# Patient Record
Sex: Female | Born: 1985 | Race: White | Hispanic: No | Marital: Single | Smoking: Never smoker
Health system: Southern US, Community
[De-identification: ages and names within clinical notes are randomized; demographics above are authoritative.]

## PROBLEM LIST (undated history)

## (undated) DIAGNOSIS — K589 Irritable bowel syndrome without diarrhea: Secondary | ICD-10-CM

## (undated) DIAGNOSIS — E0590 Thyrotoxicosis, unspecified without thyrotoxic crisis or storm: Secondary | ICD-10-CM

## (undated) HISTORY — DX: Irritable bowel syndrome, unspecified: K58.9

## (undated) HISTORY — PX: AUGMENTATION MAMMAPLASTY: SUR837

## (undated) HISTORY — PX: LEEP: SHX91

---
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
## 2007-10-13 ENCOUNTER — Ambulatory Visit: Payer: Self-pay | Admitting: Family Medicine

## 2007-10-13 DIAGNOSIS — R109 Unspecified abdominal pain: Secondary | ICD-10-CM | POA: Insufficient documentation

## 2007-10-13 DIAGNOSIS — R350 Frequency of micturition: Secondary | ICD-10-CM | POA: Insufficient documentation

## 2007-10-13 DIAGNOSIS — K219 Gastro-esophageal reflux disease without esophagitis: Secondary | ICD-10-CM | POA: Insufficient documentation

## 2007-10-13 DIAGNOSIS — N898 Other specified noninflammatory disorders of vagina: Secondary | ICD-10-CM | POA: Insufficient documentation

## 2007-10-13 LAB — CONVERTED CEMR LAB
Blood in Urine, dipstick: NEGATIVE
Glucose, Urine, Semiquant: NEGATIVE
Ketones, urine, test strip: NEGATIVE
Nitrite: NEGATIVE
Protein, U semiquant: NEGATIVE
Specific Gravity, Urine: 1.015
Urobilinogen, UA: 0.2
WBC Urine, dipstick: NEGATIVE

## 2007-10-15 LAB — CONVERTED CEMR LAB
AST: 23 units/L (ref 0–37)
Albumin: 3.6 g/dL (ref 3.5–5.2)
Alkaline Phosphatase: 44 units/L (ref 39–117)
Basophils Relative: 0 % (ref 0.0–3.0)
Bilirubin, Direct: 0.1 mg/dL (ref 0.0–0.3)
CO2: 29 meq/L (ref 19–32)
Chloride: 102 meq/L (ref 96–112)
Creatinine, Ser: 0.8 mg/dL (ref 0.4–1.2)
Eosinophils Relative: 1.7 % (ref 0.0–5.0)
GFR calc non Af Amer: 96 mL/min
MCHC: 34.6 g/dL (ref 30.0–36.0)
MCV: 92.5 fL (ref 78.0–100.0)
Monocytes Absolute: 0.1 10*3/uL (ref 0.1–1.0)
Monocytes Relative: 2 % — ABNORMAL LOW (ref 3.0–12.0)
Neutro Abs: 5 10*3/uL (ref 1.4–7.7)
Neutrophils Relative %: 72.4 % (ref 43.0–77.0)
Platelets: 222 10*3/uL (ref 150–400)
RDW: 11.6 % (ref 11.5–14.6)
Sed Rate: 15 mm/hr (ref 0–22)
TSH: 1.17 microintl units/mL (ref 0.35–5.50)
Total Bilirubin: 0.6 mg/dL (ref 0.3–1.2)
Total Protein: 6.6 g/dL (ref 6.0–8.3)

## 2008-06-27 ENCOUNTER — Telehealth: Payer: Self-pay | Admitting: Family Medicine

## 2008-06-27 ENCOUNTER — Ambulatory Visit: Payer: Self-pay | Admitting: Family Medicine

## 2008-06-27 DIAGNOSIS — B373 Candidiasis of vulva and vagina: Secondary | ICD-10-CM | POA: Insufficient documentation

## 2008-06-27 LAB — CONVERTED CEMR LAB

## 2008-07-03 ENCOUNTER — Telehealth: Payer: Self-pay | Admitting: Family Medicine

## 2009-02-27 ENCOUNTER — Ambulatory Visit: Payer: Self-pay | Admitting: Family Medicine

## 2009-02-27 ENCOUNTER — Encounter (INDEPENDENT_AMBULATORY_CARE_PROVIDER_SITE_OTHER): Payer: Self-pay | Admitting: *Deleted

## 2009-02-27 DIAGNOSIS — R197 Diarrhea, unspecified: Secondary | ICD-10-CM | POA: Insufficient documentation

## 2009-02-27 DIAGNOSIS — N63 Unspecified lump in unspecified breast: Secondary | ICD-10-CM | POA: Insufficient documentation

## 2009-03-01 LAB — CONVERTED CEMR LAB
ALT: 33 units/L (ref 0–35)
AST: 39 units/L — ABNORMAL HIGH (ref 0–37)
Albumin: 3.9 g/dL (ref 3.5–5.2)
BUN: 5 mg/dL — ABNORMAL LOW (ref 6–23)
Basophils Absolute: 0 10*3/uL (ref 0.0–0.1)
CO2: 27 meq/L (ref 19–32)
Calcium: 9.4 mg/dL (ref 8.4–10.5)
Eosinophils Absolute: 0.1 10*3/uL (ref 0.0–0.7)
Eosinophils Relative: 1.8 % (ref 0.0–5.0)
GFR calc non Af Amer: 110.66 mL/min (ref >60)
Hemoglobin: 13.7 g/dL (ref 12.0–15.0)
MCHC: 33.6 g/dL (ref 30.0–36.0)
MCV: 94.1 fL (ref 78.0–100.0)
Neutro Abs: 3.7 10*3/uL (ref 1.4–7.7)
Platelets: 206 10*3/uL (ref 150.0–400.0)
RDW: 11.2 % — ABNORMAL LOW (ref 11.5–14.6)
Sodium: 141 meq/L (ref 135–145)
Total Protein: 7 g/dL (ref 6.0–8.3)
WBC: 5.7 10*3/uL (ref 4.5–10.5)

## 2009-03-05 ENCOUNTER — Encounter: Admission: RE | Admit: 2009-03-05 | Discharge: 2009-03-05 | Payer: Self-pay | Admitting: Family Medicine

## 2009-03-26 ENCOUNTER — Ambulatory Visit: Payer: Self-pay | Admitting: Gastroenterology

## 2009-03-26 DIAGNOSIS — R112 Nausea with vomiting, unspecified: Secondary | ICD-10-CM | POA: Insufficient documentation

## 2009-03-26 LAB — CONVERTED CEMR LAB
Albumin: 4 g/dL (ref 3.5–5.2)
Alkaline Phosphatase: 44 units/L (ref 39–117)
Basophils Relative: 0.6 % (ref 0.0–3.0)
Calcium: 9.4 mg/dL (ref 8.4–10.5)
Chloride: 105 meq/L (ref 96–112)
Eosinophils Absolute: 0.1 10*3/uL (ref 0.0–0.7)
GFR calc non Af Amer: 110.58 mL/min (ref >60)
Glucose, Bld: 84 mg/dL (ref 70–99)
HCT: 41.1 % (ref 36.0–46.0)
Hemoglobin: 13.8 g/dL (ref 12.0–15.0)
Lymphs Abs: 2.2 10*3/uL (ref 0.7–4.0)
MCHC: 33.6 g/dL (ref 30.0–36.0)
MCV: 92.7 fL (ref 78.0–100.0)
Neutro Abs: 3 10*3/uL (ref 1.4–7.7)
Platelets: 220 10*3/uL (ref 150.0–400.0)
Potassium: 3.9 meq/L (ref 3.5–5.1)
RBC: 4.44 M/uL (ref 3.87–5.11)
Sodium: 141 meq/L (ref 135–145)
Total Protein: 7.6 g/dL (ref 6.0–8.3)

## 2009-04-11 ENCOUNTER — Ambulatory Visit: Payer: Self-pay | Admitting: Gastroenterology

## 2009-05-09 ENCOUNTER — Ambulatory Visit: Payer: Self-pay | Admitting: Family Medicine

## 2009-05-09 LAB — CONVERTED CEMR LAB
KOH Prep: NEGATIVE
Ketones, urine, test strip: NEGATIVE
Nitrite: NEGATIVE
Protein, U semiquant: NEGATIVE
Urobilinogen, UA: 0.2
pH: 7.5

## 2009-09-21 ENCOUNTER — Telehealth: Payer: Self-pay | Admitting: Family Medicine

## 2009-09-24 ENCOUNTER — Telehealth: Payer: Self-pay | Admitting: Family Medicine

## 2009-09-28 ENCOUNTER — Ambulatory Visit: Payer: Self-pay | Admitting: Family Medicine

## 2009-11-20 ENCOUNTER — Telehealth: Payer: Self-pay | Admitting: Family Medicine

## 2010-02-26 NOTE — Progress Notes (Signed)
Summary: needs more diflucan  Phone Note Call from Patient Call back at [phone]   Caller: Patient Summary of Call: Pt was treated a few days ago with diflucan, for a yeast infection.  Sxs are not any better and pt is asking for a refill to be sent to [facility].  She says it usually takes more than one dose to clear her up. Initial call taken by: Lowella Petties CMA,  September 24, 2009 9:12 AM  Follow-up for Phone Call        sent -- f/u with AEB if not clearing. Follow-up by: Hannah Beat MD,  September 24, 2009 9:14 AM  Additional Follow-up for Phone Call Additional follow up Details #1::        patient advised.Consuello Masse CMA   Additional Follow-up by: Benny Lennert CMA Duncan Dull),  September 24, 2009 9:19 AM    Prescriptions: FLUCONAZOLE 150 MG TABS (FLUCONAZOLE) 1 tab po daily  #2 x 0   Entered and Authorized by:   Hannah Beat MD   Signed by:   Hannah Beat MD on 09/24/2009   Method used:   [facility]ically [facility]        [facility] [address]       [address]       [place], [place]  [address]       Ph: [phone]       Fax: [phone]   RxID:   [id number]

## 2010-02-26 NOTE — Letter (Signed)
Summary: Results Letter  [facility]  [address] [place], [place] [address]   Phone: [phone]  Fax: [phone]        March 26, 2009 MRN: [id number]    [facility] [address] [place], [place]  [address]    Dear Ms. Forlenza,  It is my pleasure to have treated you recently as a new patient in my office. I appreciate your confidence and the opportunity to participate in your care.  Since I do have a busy inpatient endoscopy schedule and office schedule, my office hours vary weekly. I am, however, available for emergency calls everyday through my office. If I am not available for an urgent office appointment, another one of our gastroenterologist will be able to assist you.  My well-trained staff are prepared to help you at all times. For emergencies after office hours, a physician from our Gastroenterology section is always available through my 24 hour answering service  Once again I welcome you as a new patient and I look forward to a happy and healthy relationship             Sincerely,  Louis Meckel MD  This letter has been electronically signed by your physician.  Appended Document: Results Letter letter mailed

## 2010-02-26 NOTE — Procedures (Signed)
Summary: Upper Endoscopy  Patient: Mary Mcclure Note: All result statuses are Final unless otherwise noted.  Tests: (1) Upper Endoscopy (EGD)   EGD Upper Endoscopy       DONE     [facility]     [address]     [place], [place]  [address]           ENDOSCOPY PROCEDURE REPORT           PATIENT:  Mary, Mcclure  MR#:  [id number]     BIRTHDATE:  1985-09-20, 23 yrs. old  GENDER:  female           ENDOSCOPIST:  Barbette Hair. Arlyce Dice, MD     Referred by:  Excell Seltzer, M.D.           PROCEDURE DATE:  04/11/2009     PROCEDURE:  EGD, diagnostic     ASA CLASS:  Class I     INDICATIONS:  nausea and vomiting           MEDICATIONS:   Fentanyl 75 mcg IV, Versed 7 mg IV, glycopyrrolate     (Robinal) 0.2 mg IV, 0.6cc simethancone 0.6 cc PO     TOPICAL ANESTHETIC:  Exactacain Spray           DESCRIPTION OF PROCEDURE:   After the risks benefits and     alternatives of the procedure were thoroughly explained, informed     consent was obtained.  The LB [id number] endoscope was     introduced through the mouth and advanced to the third portion of     the duodenum, without limitations.  The instrument was slowly     withdrawn as the mucosa was fully examined.     <<PROCEDUREIMAGES>>           The upper, middle, and distal third of the esophagus were     carefully inspected and no abnormalities were noted. The z-line     was well seen at the GEJ. The endoscope was pushed into the fundus     which was normal including a retroflexed view. The antrum,gastric     body, first and second part of the duodenum were unremarkable (see     image1, image2, image3, image4, image5, image6, and image7).     Retroflexed views revealed no abnormalities.    The scope was then     withdrawn from the patient and the procedure completed.     COMPLICATIONS:  None           ENDOSCOPIC IMPRESSION:     1) Normal EGD     RECOMMENDATIONS:     1) continue PPI - aciphex (pt reports improvement in  symptoms)     2) Call office next 2-3 days to schedule an office appointment     for 2-3 weeks           REPEAT EXAM:  No           ______________________________     Barbette Hair. Arlyce Dice, MD           CC:           n.     eSIGNED:   Barbette Hair. Kaplan at 04/11/2009 02:08 PM           Mary Mcclure, [id number]  Note: An exclamation mark (!) indicates a result that was not dispersed into the flowsheet. Document Creation Date: 04/11/2009 2:08 PM _______________________________________________________________________  (1) Order result status: Final  Collection or observation date-time: 04/11/2009 14:02 Requested date-time:  Receipt date-time:  Reported date-time:  Referring Physician:   Ordering Physician: Melvia Heaps [phone]) Specimen Source:  Source: Launa Grill Order Number: [id number] Lab site:

## 2010-02-26 NOTE — Progress Notes (Signed)
Summary: pt has another yeast infection  Phone Note Call from Patient Call back at [phone]   Caller: Patient Call For: Mary Nora MD Summary of Call: Pt has another yeast infection- discharge, itching, swelling.  She is asking for diflucan, but she says the usual dose does not work for her.  She is asking if she can have refills on this, since she gets these infections so frequently.  Uses [facility]. Initial call taken by: Lowella Petties CMA, AAMA,  November 20, 2009 8:25 AM  Follow-up for Phone Call        patient advised and also told if this doesnt work then patient will need office visit.   November 20, 2009 9:11 AM  Follow-up by: Benny Lennert CMA Duncan Dull),  November 20, 2009 9:11 AM    Prescriptions: FLUCONAZOLE 150 MG TABS (FLUCONAZOLE) 1 tab po daily  #5 x 1   Entered and Authorized by:   Mary Nora MD   Signed by:   Mary Nora MD on 11/20/2009   Method used:   [facility]ronically to        [facility] [address]       [address], [address]       [place], [place]  [address]       Ph: [phone]       Fax: [phone]   RxID:   [id number]

## 2010-02-26 NOTE — Assessment & Plan Note (Signed)
Summary: yeast infection/jbb   Vital Signs:  Patient Profile:   25 Years Old Female CC:      Possible Yeast Infection / rwt Height:     64.75 inches Weight:      124 pounds BMI:     20.87 O2 Sat:      99 % O2 treatment:    Room Air Temp:     97.9 degrees F oral Pulse rate:   76 / minute Pulse rhythm:   regular Resp:     18 per minute BP sitting:   134 / 85  Pt. in pain?   no  Vitals Entered By: Levonne Spiller EMT-P (September 28, 2009 4:21 PM)              Is Patient Diabetic? No      Current Allergies: No known allergies  History of Present Illness History from: patient Reason for visit: see chief complaint Chief Complaint: Possible Yeast Infection / rwt History of Present Illness: Was treated for a vaginal yeast infection with diflucan on 8/26, she did not get much relief and was treated again with diflucan this time at a higher dosage. She reports some relief, but continues with itch, white vaginal discharge and erythema. With her last yeast infection, it took about 3 weeks to clear up. Previously she had Abx prior, but this time was no known trigger.  REVIEW OF SYSTEMS Constitutional Symptoms      Denies fever, chills, night sweats, weight loss, weight gain, and fatigue.       Genitourniary       Denies painful urination, blood or discharge from vagina, kidney stones, and loss of urinary control.      Comments: d/c  PMH-FH-SH reviewed for relevance Physical Exam General appearance: well developed, well nourished, no acute distress Skin: no obvious rashes or lesions MSE: oriented to time, place, and person  Plan New Medications/Changes: KETOCONAZOLE 200 MG TABS (KETOCONAZOLE) 1 by mouth daily for infection x 10 days  #10 x 0, 09/28/2009, Tacey Ruiz MD  New Orders: Est. Patient Level II [[address]] Planning Comments:   Will send for culture if no improvement or f/u with primary care physician.   The patient and/or caregiver has been counseled thoroughly with  regard to medications prescribed including dosage, schedule, interactions, rationale for use, and possible side effects and they verbalize understanding.  Diagnoses and expected course of recovery discussed and will return if not improved as expected or if the condition worsens. Patient and/or caregiver verbalized understanding.   Prescriptions: KETOCONAZOLE 200 MG TABS (KETOCONAZOLE) 1 by mouth daily for infection x 10 days  #10 x 0   Entered and Authorized by:   Tacey Ruiz MD   Signed by:   Tacey Ruiz MD on 09/28/2009   Method used:   Electronically to        [facility] [address]       [address], [address]       [place], [place]  [address]       Ph: [phone]       Fax: [phone]   RxID:   [id number]   Orders Added: 1)  Est. Patient Level II [[address]] The risks, benefits and possible side effects of the treatments and tests were explained clearly to the patient and the patient verbalized understanding. The patient was informed that there is no on-call provider or services available at this clinic during  off-hours (when the clinic is closed).  If the patient developed a problem or concern that required immediate attention, the patient was advised to go the the nearest available urgent care or emergency department for medical care.  The patient verbalized understanding.

## 2010-02-26 NOTE — Assessment & Plan Note (Signed)
Summary: ?YEAST INFECTION/CLE   Vital Signs:  Patient profile:   25 year old female Height:      64.5 inches Weight:      132.6 pounds BMI:     22.49 Temp:     97.9 degrees F oral Pulse rate:   72 / minute Pulse rhythm:   regular BP sitting:   100 / 60  (left arm) Cuff size:   regular  Vitals Entered By: Benny Lennert CMA Duncan Dull) (May 09, 2009 3:29 PM)  History of Present Illness: Chief complaint ? yeast infection  Vaginal irritation, small amount of white discharge.  No recent antibitoics.  HAs history of frewquent yeast infections.  No fever, no abdominal pain.  Sexually active. No OTC douche or cream used. Does have new body wash.   urine cloudy as well. No dysuria.   Problems Prior to Update: 1)  Nausea With Vomiting  (ICD-787.01) 2)  Breast Mass, Left  (ICD-611.72) 3)  Diarrhea, Chronic  (ICD-787.91) 4)  Candidiasis of Vulva and Vagina  (ICD-112.1) 5)  Frequency, Urinary  (ICD-788.41) 6)  Vaginal Discharge  (ICD-623.5) 7)  Gerd  (ICD-530.81) 8)  Abdominal Cramps  (ICD-789.00)  Current Medications (verified): 1)  Ortho Tri-Cyclen (28) 0.035 Mg Tabs (Norgestimate-Ethinyl Estradiol) .... Take 1 Tablet By Mouth Once A Day  Allergies (verified): No Known Drug Allergies  Past History:  Past medical, surgical, family and social histories (including risk factors) reviewed, and no changes noted (except as noted below).  Past Medical History: Reviewed history from 10/13/2007 and no changes required. Unremarkable  Past Surgical History: Reviewed history from 10/13/2007 and no changes required. Denies surgical history  Family History: Reviewed history from 03/26/2009 and no changes required. father: GERD, HTN mother:healthy MGM: lung cancer Family History of Colon Cancer: MGM-deceased 43, Father-dx'd early 33's Family History of Colitis/Crohn's: MGF Family History of Colon Polyps: MGF Family History of Diabetes: MGGF, MGGM, Mat Uncle Family History of  Heart Disease: MGGF, Mat Uncle, Father-chronic A Fib  Social History: Reviewed history from 03/26/2009 and no changes required. 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 CV:  Denies chest pain or discomfort.  Physical Exam  General:  Well-developed,well-nourished,in no acute distress; alert,appropriate and cooperative throughout examination Genitalia:  normal introitus, no external lesions, mucosa pink and moist, and vaginal discharge.     Impression & Recommendations:  Problem # 1:  CANDIDIASIS OF VULVA AND VAGINA (ICD-112.1)  Treat yeast infecton with fluconazole.  Her updated medication list for this problem includes:    Fluconazole 150 Mg Tabs (Fluconazole) .Marland Kitchen... 1 tab po daily  Orders: Wet Prep (54098JX) UA Dipstick w/o Micro (manual) (91478)  Complete Medication List: 1)  Ortho Tri-cyclen (28) 0.035 Mg Tabs (Norgestimate-ethinyl estradiol) .... Take 1 tablet by mouth once a day 2)  Fluconazole 150 Mg Tabs (Fluconazole) .Marland Kitchen.. 1 tab po daily Prescriptions: FLUCONAZOLE 150 MG TABS (FLUCONAZOLE) 1 tab po daily  #1 x 0   Entered and Authorized by:   Kerby Nora MD   Signed by:   Kerby Nora MD on 05/09/2009   Method used:   Electronically to        [facility] [address]       [address]       [place], [place]  [address]       Ph: [phone]       Fax: [phone]   RxID:   [id number]   Current Allergies (reviewed today): No known allergies   Laboratory Results   Urine Tests  Date/Time Received: May 09, 2009 3:36 PM  Date/Time Reported: May 09, 2009 3:36 PM   Routine Urinalysis   Color:  yellow Appearance: Clear Glucose: negative   (Normal Range: Negative) Bilirubin: negative   (Normal Range: Negative) Ketone: negative   (Normal Range: Negative) Spec. Gravity: 1.010   (Normal Range: 1.003-1.035) Blood: negative   (Normal Range: Negative) pH: 7.5   (Normal Range: 5.0-8.0) Protein: negative   (Normal Range: Negative) Urobilinogen: 0.2   (Normal Range: 0-1) Nitrite: negative   (Normal Range: Negative) Leukocyte Esterace: negative   (Normal Range: Negative)      Wet Mount Source: vaginal Bacteria/hpf: neg Clue cells/hpf: neg Yeast/hpf: positive Wet Mount KOH: Negative Trichomonas/hpf: neg

## 2010-02-26 NOTE — Progress Notes (Signed)
Summary: requests diflucan  Phone Note Call from Patient Call back at [phone]   Caller: Patient Summary of Call: Pt states she has a yeast infection- redness, white thick discharge, itching.  She is asking if diflucan can be called to [facility] [address]. Initial call taken by: Lowella Petties CMA,  September 21, 2009 8:33 AM    Prescriptions: FLUCONAZOLE 150 MG TABS (FLUCONAZOLE) 1 tab po daily  #1 x 0   Entered and Authorized by:   Hannah Beat MD   Signed by:   Hannah Beat MD on 09/21/2009   Method used:   Electronically to        [facility] [address]       [address], [address]       [place], [place]  [address]       Ph: [phone]       Fax: [phone]   RxID:   [id number]

## 2010-02-26 NOTE — Letter (Signed)
Summary: New Patient letter  [facility]  [address] [place], [place] [address]   Phone: [phone]  Fax: [phone]       02/27/2009 MRN: [id number]  [facility] [address] [place], [place]  [address]  Dear Ms. Maciejewski,  Welcome to the Gastroenterology Division at [facility].    You are scheduled to see Dr. Arlyce Dice on 03-26-09 at 3:00p.m. on the 3rd floor at [facility], [address]  We ask that you try to arrive at our office 15 minutes prior to your appointment time to allow for check-in.  We would like you to complete the enclosed self-administered evaluation form prior to your visit and bring it with you on the day of your appointment.  We will review it with you.  Also, please bring a complete list of all your medications or, if you prefer, bring the medication bottles and we will list them.  Please bring your insurance card so that we may make a copy of it.  If your insurance requires a referral to see a specialist, please bring your referral form from your primary care physician.  Co-payments are due at the time of your visit and may be paid by cash, check or credit card.     Your office visit will consist of a consult with your physician (includes a physical exam), any laboratory testing he/she may order, scheduling of any necessary diagnostic testing (e.g. x-ray, ultrasound, CT-scan), and scheduling of a procedure (e.g. Endoscopy, Colonoscopy) if required.  Please allow enough time on your schedule to allow for any/all of these possibilities.    If you cannot keep your appointment, please call [phone] to cancel or reschedule prior to your appointment date.  This allows [place] the opportunity to schedule an appointment for another patient in need of care.  If you do not cancel or reschedule by 5 p.m. the business day prior to your appointment date, you will be charged a $50.00 late cancellation/no-show fee.    Thank you for choosing  [facility] for your medical needs.  We appreciate the opportunity to care for you.  Please visit [place] at our website  to learn more about our practice.                     Sincerely,                                                             The Gastroenterology Division

## 2010-02-26 NOTE — Assessment & Plan Note (Signed)
Summary: CHRONIC DIARRHEA,GERD, ABD.PAIN--CH.   History of Present Illness Visit Type: consult Primary GI MD: Melvia Heaps MD [facility] Primary Provider: Kerby Nora, MD Chief Complaint: lower abdominal pain, sudden onset nausea and vomiting History of Present Illness:   Mary Mcclure is a 25 year old white female referred at the request of Dr. Ermalene Searing for evaluation of abdominal discomfort and vomiting.  For years she has had a very sensitive stomach characterized by frequent postprandial diarrhea with urgency.  She often has postprandial bloating and mild nausea.  Very irregularly she will  vomit.  This is not preceded by nausea or  meals.  There is no history of melena or hematochezia.  She has been on oral contraceptive for 6 years.  She had stomach issues as an infant but not as a young child.  Family history is notable for Crohn's disease in her maternal grandfather.  The patient denies stress or anxiety.   GI Review of Systems    Reports abdominal pain, acid reflux, belching, bloating, heartburn, nausea, and  vomiting.     Location of  Abdominal pain: lower abdomen.    Denies chest pain, dysphagia with liquids, dysphagia with solids, loss of appetite, vomiting blood, weight loss, and  weight gain.      Reports diarrhea.     Denies anal fissure, black tarry stools, change in bowel habit, constipation, diverticulosis, fecal incontinence, heme positive stool, hemorrhoids, irritable bowel syndrome, jaundice, light color stool, liver problems, rectal bleeding, and  rectal pain.    Current Medications (verified): 1)  Ortho Tri-Cyclen (28) 0.035 Mg Tabs (Norgestimate-Ethinyl Estradiol) .... Take 1 Tablet By Mouth Once A Day  Allergies (verified): No Known Drug Allergies  Past History:  Past Medical History: Reviewed history from 10/13/2007 and no changes required. Unremarkable  Past Surgical History: Reviewed history from 10/13/2007 and no changes required. 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  The patient denies allergy/sinus, anemia, anxiety-new, arthritis/joint pain, back pain, blood in urine, confusion, cough, coughing up blood, depression-new, fainting, fatigue, fever, headaches-new, hearing problems, heart murmur, heart rhythm changes, itching, menstrual pain, muscle pains/cramps, night sweats, nosebleeds, pregnancy symptoms, shortness of breath, skin rash, sleeping problems, sore throat, swelling of feet/legs, swollen lymph glands, thirst - excessive, urination - excessive, urination changes/pain, urine leakage, vision changes, and voice change.         All other systems were reviewed and were negative   Vital Signs:  Patient profile:   25 year old female Height:      64.5 inches Weight:      132 pounds BMI:     22.39 Pulse rate:   72 / minute Pulse rhythm:   regular BP sitting:   90 / 62  (left arm) Cuff size:   regular  Vitals Entered By: Mary Mcclure) (March 26, 2009 3:08 PM)  Physical Exam  Additional  Exam:  She is well-developed well-nourished female  skin: anicteric HEENT: normocephalic; PEERLA; no nasal or pharyngeal abnormalities neck: supple nodes: no cervical lymphadenopathy chest: clear to ausculatation and  percussion heart: no murmurs, gallops, or rubs abd: soft, nontender; BS normoactive; no abdominal masses, tenderness, organomegaly; there is no succussion splash rectal: deferred ext: no cynanosis, clubbing, edema skeletal: no deformities neuro: oriented x 3; no focal abnormalities    Impression & Recommendations:  Problem # 1:  NAUSEA WITH VOMITING (ICD-787.01) Symptoms could be due to ulcer or nonulcer dyspepsia.  She does not straike me as particularly anxious although symptoms from anxiety must be considered.  Gastroparesis is also a possibility.  She may have GERD that is contributing to her dyspepsia.  Recommendations #1 trial of AcipHex 20 mg a day #2 upper endoscopy #3 gastric emptying scan if numbers  one and 2 arer not diagnostic and she has not improved Orders: EGD (EGD) TLB-CBC Platelet - w/Differential (85025-CBCD) TLB-CRP-High Sensitivity (C-Reactive Protein) (86140-FCRP) TLB-CMP (Comprehensive Metabolic Pnl) (80053-COMP)  Problem # 2:  DIARRHEA, CHRONIC (ICD-787.91) She most likely has irritable bowel.  Family history is notable for Crohn's disease.  Recommendations #1 patient was instructed to take a dietary history #2 reassess after her upper GI symptoms are resolved; to consider small bowel series and colonoscopy Orders: TLB-CBC Platelet - w/Differential (85025-CBCD) TLB-CRP-High Sensitivity (C-Reactive Protein) (86140-FCRP) TLB-CMP (Comprehensive Metabolic Pnl) (80053-COMP)  Patient Instructions: 1)  Conscious Sedation brochure given.  2)  Upper Endoscopy brochure given.  3)  Your EGD is scheduled for 04/11/2009 at 1:30pm 4)  The medication list was reviewed and reconciled.  All changed / newly prescribed medications were explained.  A complete medication list was provided to the patient / caregiver.

## 2010-02-26 NOTE — Letter (Signed)
Summary: EGD Instructions  [facility]  [address] [place], [place] [address]   Phone: [phone]  Fax: [phone]       Mary Mcclure    11/08/1985    MRN: [id number]       Procedure Day /Date:WEDNESDAY 04/11/2009     Arrival Time: 12:30PM     Procedure Time:1:30PM     Location of Procedure:                    X  [facility] (4th Floor)    PREPARATION FOR ENDOSCOPY   On 04/11/2009  THE DAY OF THE PROCEDURE:  1.   No solid foods, milk or milk products are allowed after midnight the night before your procedure.  2.   Do not drink anything colored red or purple.  Avoid juices with pulp.  No orange juice.  3.  You may drink clear liquids until11:30AM, which is 2 hours before your procedure.                                                                                                CLEAR LIQUIDS INCLUDE: Water Jello Ice Popsicles Tea (sugar ok, no milk/cream) Powdered fruit flavored drinks Coffee (sugar ok, no milk/cream) Gatorade Juice: apple, white grape, white cranberry  Lemonade Clear bullion, consomm, broth Carbonated beverages (any kind) Strained chicken noodle soup Hard Candy   MEDICATION INSTRUCTIONS  Unless otherwise instructed, you should take regular prescription medications with a small sip of water as early as possible the morning of your procedure.            OTHER INSTRUCTIONS  You will need a responsible adult at least 24 years of age to accompany you and drive you home.   This person must remain in the waiting room during your procedure.  Wear loose fitting clothing that is easily removed.  Leave jewelry and other valuables at home.  However, you may wish to bring a book to read or an iPod/MP3 player to listen to music as you wait for your procedure to start.  Remove all body piercing jewelry and leave at home.  Total time from sign-in until discharge is approximately 2-3 hours.  You should go home  directly after your procedure and rest.  You can resume normal activities the day after your procedure.  The day of your procedure you should not:   Drive   Make legal decisions   Operate machinery   Drink alcohol   Return to work  You will receive specific instructions about eating, activities and medications before you leave.    The above instructions have been reviewed and explained to me by   _______________________    I fully understand and can verbalize these instructions _____________________________ Date _________

## 2010-02-26 NOTE — Assessment & Plan Note (Signed)
Summary: CHECK LUMP IN BREAST/CLE   Vital Signs:  Patient profile:   25 year old female Height:      64.5 inches Weight:      137.0 pounds BMI:     23.24 Temp:     98.0 degrees F oral Pulse rate:   68 / minute Pulse rhythm:   regular BP sitting:   108 / 62  (left arm) Cuff size:   regular  Vitals Entered By: Benny Lennert CMA Duncan Dull) (February 27, 2009 2:20 PM)  History of Present Illness: Chief complaint Lump in left breast times 3-4 weeks  Left breast mass, nontender..noted 1 month ago...minimal change in size. Last menses in last week. No redness, no rash, no nipple changes.  No past breast lesions.  Mother with history of breast lesions.  benign.   Has had years of stomach issues, she wonders if she needs referral Main symptom is frequent BMs about 6 a day, occ loose but usually formed No constipation Abdominal bloating and pain after eating, relief with BM, explosive Worse with dairy, fatty foods, [place], but really happens after every meal No blood in stool Has occ heartburn and reflux of food several times a week  after some foods, burping 2 times a month Has used prilosec intermittantly.    Has started having more vomiting episodes ..no real relationship to food.   Very sensitive to alcohol.   Grandfather with Chron's disease.   Problems Prior to Update: 1)  Candidiasis of Vulva and Vagina  (ICD-112.1) 2)  Frequency, Urinary  (ICD-788.41) 3)  Vaginal Discharge  (ICD-623.5) 4)  Gerd  (ICD-530.81) 5)  Abdominal Cramps  (ICD-789.00)  Current Medications (verified): 1)  Ortho Tri-Cyclen (28) 0.035 Mg Tabs (Norgestimate-Ethinyl Estradiol) .... Take 1 Tablet By Mouth Once A Day  Allergies (verified): No Known Drug Allergies  Past History:  Past medical, surgical, family and social histories (including risk factors) reviewed, and no changes noted (except as noted below).  Past Medical History: Reviewed history from 10/13/2007 and no changes  required. Unremarkable  Past Surgical History: Reviewed history from 10/13/2007 and no changes required. Denies surgical history  Family History: Reviewed history from 10/13/2007 and no changes required. father: GERD, HTN mother:healthy MGF: cancer unknown type, chron's disease MGM: lung cancer  Social History: Reviewed history from 10/13/2007 and no changes required. 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 ENT:  Has very large tonsils.Marland Kitchengets a lot of viral infections. Has had mono, strep throat twice in last year. Minimal snoring. No trouble swallowing . CV:  Denies chest pain or discomfort. Resp:  Denies shortness of breath.  Physical Exam  General:  Well-developed,well-nourished,in no acute distress; alert,appropriate and cooperative throughout examination Mouth:  MMM, large tonsils and large tonsilar crypts Neck:  no carotid bruit or thyromegaly no cervical or supraclavicular lymphadenopathy  Breasts:  2 oclock firm feeling nreast lesion, mobile Lungs:  Normal respiratory effort, chest expands symmetrically. Lungs are clear to auscultation, no crackles or wheezes. Heart:  Normal rate and regular rhythm. S1 and S2 normal without gallop, murmur, click, rub or other extra sounds. Abdomen:  Bowel sounds positive,abdomen soft and non-tender without masses, organomegaly or hernias noted. Pulses:  R and L posterior tibial pulses are full and equal  bilaterally  Extremities:  no edema  Skin:  Intact without suspicious lesions or rashes Psych:  Cognition and judgment appear intact. Alert and cooperative with normal attention span and concentration. No apparent delusions, illusions, hallucinations   Impression &  Recommendations:  Problem # 1:  BREAST MASS, LEFT (ICD-611.72) Likely benign fibroadenoma or cyst, but given persistance past 1 month..will image with [place] and possible mammogram. Orders: Radiology Referral (Radiology)  Problem # 2:  DIARRHEA, CHRONIC (ICD-787.91) ? diarrhea predominant IBS? Will reeval with labs. Given persistance will refer to GI.  No clear red flags for IBD except family history of Chron's May try align probiotic.  Orders: Gastroenterology Referral (GI) TLB-TSH (Thyroid Stimulating Hormone) (84443-TSH) TLB-BMP (Basic Metabolic Panel-BMET) (80048-METABOL) TLB-Hepatic/Liver Function Pnl (80076-HEPATIC) TLB-CBC Platelet - w/Differential (85025-CBCD) T-Celiac Disease Ab Evaluation (8002)  Problem # 3:  ABDOMINAL CRAMPS (ICD-789.00)  Orders: Gastroenterology Referral (GI)  Problem # 4:  GERD (ICD-530.81) Diet modification..start daily prilosec 40 mg daily.  Orders: Gastroenterology Referral (GI)  Complete Medication List: 1)  Ortho Tri-cyclen (28) 0.035 Mg Tabs (Norgestimate-ethinyl estradiol) .... Take 1 tablet by mouth once a day  Patient Instructions: 1)  Referral Appointment Information 2)  Day/Date: 3)  Time: 4)  Place/MD: 5)  Address: 6)  Phone/Fax: 7)  Patient given appointment information. Information/Orders faxed/mailed.  8)  Start prilosec 20 mg tabs daily.  9)  Start align daily.   Current Allergies (reviewed today): No known allergies   Appended Document: CHECK LUMP IN BREAST/CLE

## 2010-03-05 ENCOUNTER — Telehealth: Payer: Self-pay | Admitting: Family Medicine

## 2010-03-07 ENCOUNTER — Telehealth: Payer: Self-pay | Admitting: Family Medicine

## 2010-03-14 NOTE — Progress Notes (Signed)
Summary: yeast infection   Phone Note Call from Patient Call back at Home Phone [phone]   Caller: Patient Call For: Mary Nora MD Summary of Call: Patient says that she went to the dentist for her abscessed tooth and they put her on amoxicllin and now she has a yeast infection. She is asking for a rx for diflucan to be called [facility] to [facility] [address]   Initial call taken by: Melody Comas,  March 07, 2010 8:40 AM    New/Updated Medications: FLUCONAZOLE 150 MG TABS (FLUCONAZOLE) 1 tab by mouth daily x 1 Prescriptions: FLUCONAZOLE 150 MG TABS (FLUCONAZOLE) 1 tab by mouth daily x 1  #1 x 0   Entered and Authorized by:   Mary Nora MD   Signed by:   Mary Nora MD on 03/07/2010   Method used:   [facility]ically to        [facility] [phone]* (retail)       [address] [place], [place]  [address]       Ph: [phone] or [phone]       Fax: [phone]   RxID:   [id number]

## 2010-03-14 NOTE — Progress Notes (Signed)
Summary: pt has abscessed tooth  Phone Note Call from Patient   Caller: Patient Summary of Call: Pt says she has an abscessed tooth and needs to see you today.  I advised her to call her dentist and she said she has called several times, and has gone over there, but no answer.  I told her that they should have a number to call for emergencies but she says they dont.  I suggested she go to [facility] to see if they can help her. Initial call taken by: Lowella Petties CMA, AAMA,  March 05, 2010 11:10 AM  Follow-up for Phone Call        I can add on in afternoon today. Follow-up by: Kerby Nora MD,  March 05, 2010 11:13 AM  Additional Follow-up for Phone Call Additional follow up Details #1::        [facility] for pt to call back.                 Lowella Petties CMA, AAMA  March 05, 2010 12:02 PM

## 2010-03-14 NOTE — Progress Notes (Signed)
Summary: wants more diflucan  Phone Note Call from Patient Call back at [phone]   Caller: Patient Call For: Kerby Nora MD Summary of Call: Pt called in this morning for diflucan for yeast infection.  Only one pill was called in and she says she always needs more to cure her infections.  Says sometimes her infections last for weeks.   Wants sent to [facility] [address]. Initial call taken by: Lowella Petties CMA, AAMA,  March 07, 2010 3:09 PM  Follow-up for Phone Call        Typically only one needed.. call and given her a refill  for 3 tabs with 1 refill, but if not resolving she needs appt to be tested for other causes of similar symptoms.   Follow-up by: Kerby Nora MD,  March 08, 2010 8:26 AM  Additional Follow-up for Phone Call Additional follow up Details #1::        rx sent to pharmacy and patietn advised via message if not any better after these then she needs to come to office for furthur eval Additional Follow-up by: Benny Lennert CMA Duncan Dull),  March 08, 2010 8:37 AM    Prescriptions: FLUCONAZOLE 150 MG TABS (FLUCONAZOLE) 1 tab by mouth daily x 1  #3 x 1   Entered by:   Benny Lennert CMA (AAMA)   Authorized by:   Kerby Nora MD   Signed by:   Benny Lennert CMA (AAMA) on 03/08/2010   Method used:   Electronically to        [facility] [address] (retail)       [address], [address]       [place], [place]  [address]       Ph: [phone]       Fax: [phone]   RxID:   [id number]

## 2010-09-11 ENCOUNTER — Encounter: Payer: Self-pay | Admitting: Family Medicine

## 2010-09-12 ENCOUNTER — Ambulatory Visit (INDEPENDENT_AMBULATORY_CARE_PROVIDER_SITE_OTHER): Payer: BC Managed Care – PPO | Admitting: Family Medicine

## 2010-09-12 ENCOUNTER — Encounter: Payer: Self-pay | Admitting: Family Medicine

## 2010-09-12 VITALS — BP 98/60 | HR 74 | Temp 98.1°F | Ht 66.0 in | Wt 138.1 lb

## 2010-09-12 DIAGNOSIS — N760 Acute vaginitis: Secondary | ICD-10-CM | POA: Insufficient documentation

## 2010-09-12 DIAGNOSIS — A499 Bacterial infection, unspecified: Secondary | ICD-10-CM

## 2010-09-12 DIAGNOSIS — B9689 Other specified bacterial agents as the cause of diseases classified elsewhere: Secondary | ICD-10-CM | POA: Insufficient documentation

## 2010-09-12 DIAGNOSIS — N898 Other specified noninflammatory disorders of vagina: Secondary | ICD-10-CM

## 2010-09-12 LAB — POCT WET PREP (WET MOUNT)

## 2010-09-12 MED ORDER — METRONIDAZOLE 500 MG PO TABS: 500.0000 mg | ORAL_TABLET | Freq: Two times a day (BID) | ORAL | Status: AC

## 2010-09-12 NOTE — Assessment & Plan Note (Signed)
Treat with oral flagyl x 7 days.

## 2010-09-12 NOTE — Progress Notes (Signed)
  Subjective:    Patient ID: Mary Mcclure, female    DOB: 1985/02/02, 25 y.o.   MRN: [id number]  HPI  25 year old female with 2 weeks of vaginal itching, mild, slight change in vaginal discharge. Very odorous. Not using any OTC med.  Has history of frequent vaginal candadiasis. Last episode few months ago.    Review of Systems  Constitutional: Negative for fever and fatigue.  Gastrointestinal: Negative for nausea, abdominal pain, diarrhea, constipation and abdominal distention.  Genitourinary: Negative for dysuria, hematuria and genital sores.       Objective:   Physical Exam  Constitutional: She appears well-developed and well-nourished.  Cardiovascular: Normal rate, normal heart sounds and intact distal pulses.   Pulmonary/Chest: Effort normal and breath sounds normal. No respiratory distress. She has no wheezes. She has no rales. She exhibits no tenderness.  Abdominal: Soft. Bowel sounds are normal. She exhibits no distension. There is no tenderness.  Genitourinary: Uterus normal. Vaginal discharge found.          Assessment & Plan:

## 2010-10-22 ENCOUNTER — Telehealth: Payer: Self-pay | Admitting: *Deleted

## 2010-10-22 ENCOUNTER — Ambulatory Visit: Payer: BC Managed Care – PPO | Admitting: Family Medicine

## 2010-10-22 NOTE — Telephone Encounter (Signed)
Patient advised and appt made

## 2010-10-22 NOTE — Telephone Encounter (Signed)
It looks like the patient had BV recently, too. We should examine her and check a slide to make sure of diagnosis before treating more. ov

## 2010-10-22 NOTE — Telephone Encounter (Signed)
Pt states she took 3 diflucans, that she had on hand,  yesterday because she has a yeast infection- itching, discharge, swelling.  She says she usually has to take about 6 or 7 to clear her up, says one never helps her.  She is asking that more be called to [facility] [address].

## 2011-02-17 ENCOUNTER — Ambulatory Visit: Payer: BC Managed Care – PPO | Admitting: Family Medicine

## 2011-10-29 ENCOUNTER — Encounter (HOSPITAL_BASED_OUTPATIENT_CLINIC_OR_DEPARTMENT_OTHER): Payer: Self-pay | Admitting: *Deleted

## 2011-10-29 NOTE — Progress Notes (Signed)
Pack an overnight bag for [facility] stay.

## 2011-10-31 ENCOUNTER — Ambulatory Visit (HOSPITAL_BASED_OUTPATIENT_CLINIC_OR_DEPARTMENT_OTHER): Payer: BC Managed Care – PPO | Admitting: Certified Registered"

## 2011-10-31 ENCOUNTER — Encounter (HOSPITAL_BASED_OUTPATIENT_CLINIC_OR_DEPARTMENT_OTHER)
Admission: RE | Disposition: A | Discharge status: Home / Self Care | Payer: Self-pay | Source: Ambulatory Visit | Attending: Otolaryngology

## 2011-10-31 ENCOUNTER — Encounter (HOSPITAL_BASED_OUTPATIENT_CLINIC_OR_DEPARTMENT_OTHER): Payer: Self-pay | Admitting: Certified Registered"

## 2011-10-31 ENCOUNTER — Ambulatory Visit (HOSPITAL_BASED_OUTPATIENT_CLINIC_OR_DEPARTMENT_OTHER)
Admission: RE | Admit: 2011-10-31 | Discharge: 2011-10-31 | Disposition: A | Discharge status: Home / Self Care | Payer: BC Managed Care – PPO | Source: Ambulatory Visit | Attending: Otolaryngology | Admitting: Otolaryngology

## 2011-10-31 ENCOUNTER — Encounter (HOSPITAL_BASED_OUTPATIENT_CLINIC_OR_DEPARTMENT_OTHER): Payer: Self-pay | Admitting: *Deleted

## 2011-10-31 DIAGNOSIS — J039 Acute tonsillitis, unspecified: Secondary | ICD-10-CM | POA: Diagnosis present

## 2011-10-31 DIAGNOSIS — K219 Gastro-esophageal reflux disease without esophagitis: Secondary | ICD-10-CM | POA: Insufficient documentation

## 2011-10-31 DIAGNOSIS — J3501 Chronic tonsillitis: Secondary | ICD-10-CM | POA: Insufficient documentation

## 2011-10-31 HISTORY — PX: TONSILLECTOMY: SHX5217

## 2011-10-31 HISTORY — DX: Thyrotoxicosis, unspecified without thyrotoxic crisis or storm: E05.90

## 2011-10-31 SURGERY — TONSILLECTOMY
Anesthesia: General | Site: Mouth | Laterality: Bilateral | Wound class: Clean Contaminated

## 2011-10-31 MED ORDER — CEFAZOLIN SODIUM-DEXTROSE 2-3 GM-% IV SOLR
INTRAVENOUS | Status: DC | PRN
  Administered 2011-10-31: 2 g via INTRAVENOUS

## 2011-10-31 MED ORDER — 0.9 % SODIUM CHLORIDE (POUR BTL) OPTIME
TOPICAL | Status: DC | PRN
  Administered 2011-10-31: 250 mL

## 2011-10-31 MED ORDER — MIDAZOLAM HCL 5 MG/5ML IJ SOLN
INTRAMUSCULAR | Status: DC | PRN
  Administered 2011-10-31 (×2): 1 mg via INTRAVENOUS

## 2011-10-31 MED ORDER — HYDROMORPHONE HCL PF 1 MG/ML IJ SOLN
0.2500 mg | INTRAMUSCULAR | Status: DC | PRN
  Administered 2011-10-31 (×2): 0.5 mg via INTRAVENOUS

## 2011-10-31 MED ORDER — OXYCODONE HCL 5 MG/5ML PO SOLN: 5.0000 mg | Freq: Once | ORAL | Status: DC | PRN

## 2011-10-31 MED ORDER — FLUCONAZOLE 150 MG PO TABS: ORAL_TABLET | ORAL | Status: DC

## 2011-10-31 MED ORDER — PHENOL 1.4 % MT LIQD: 1.0000 | OROMUCOSAL | Status: DC | PRN

## 2011-10-31 MED ORDER — SUCCINYLCHOLINE CHLORIDE 20 MG/ML IJ SOLN
INTRAMUSCULAR | Status: DC | PRN
  Administered 2011-10-31: 100 mg via INTRAVENOUS

## 2011-10-31 MED ORDER — LIDOCAINE HCL (CARDIAC) 20 MG/ML IV SOLN
INTRAVENOUS | Status: DC | PRN
  Administered 2011-10-31: 70 mg via INTRAVENOUS

## 2011-10-31 MED ORDER — HYDROCODONE-ACETAMINOPHEN 7.5-500 MG/15ML PO SOLN: ORAL | Status: DC

## 2011-10-31 MED ORDER — ONDANSETRON HCL 4 MG PO TABS: 4.0000 mg | ORAL_TABLET | ORAL | Status: DC | PRN

## 2011-10-31 MED ORDER — MORPHINE SULFATE 2 MG/ML IJ SOLN
2.0000 mg | INTRAMUSCULAR | Status: DC | PRN
  Administered 2011-10-31 (×3): 2 mg via INTRAVENOUS

## 2011-10-31 MED ORDER — HYDROCODONE-ACETAMINOPHEN 7.5-500 MG/15ML PO SOLN
10.0000 mL | ORAL | Status: DC | PRN
  Administered 2011-10-31 (×2): 15 mL via ORAL

## 2011-10-31 MED ORDER — BACITRACIN ZINC 500 UNIT/GM EX OINT
TOPICAL_OINTMENT | CUTANEOUS | Status: DC | PRN
  Administered 2011-10-31: 1 via TOPICAL

## 2011-10-31 MED ORDER — AMOXICILLIN-POT CLAVULANATE 250-62.5 MG/5ML PO SUSR: 10.0000 mL | Freq: Two times a day (BID) | ORAL | Status: DC

## 2011-10-31 MED ORDER — OXYCODONE HCL 5 MG PO TABS: 5.0000 mg | ORAL_TABLET | Freq: Once | ORAL | Status: DC | PRN

## 2011-10-31 MED ORDER — KCL IN DEXTROSE-NACL 20-5-0.9 MEQ/L-%-% IV SOLN: INTRAVENOUS | Status: DC

## 2011-10-31 MED ORDER — ACETAMINOPHEN 650 MG RE SUPP: 650.0000 mg | RECTAL | Status: DC | PRN

## 2011-10-31 MED ORDER — ONDANSETRON 4 MG PO TBDP: 4.0000 mg | ORAL_TABLET | Freq: Three times a day (TID) | ORAL | Status: DC | PRN

## 2011-10-31 MED ORDER — PROPOFOL 10 MG/ML IV BOLUS
INTRAVENOUS | Status: DC | PRN
  Administered 2011-10-31: 200 mg via INTRAVENOUS

## 2011-10-31 MED ORDER — ONDANSETRON HCL 4 MG/2ML IJ SOLN
INTRAMUSCULAR | Status: DC | PRN
  Administered 2011-10-31: 4 mg via INTRAVENOUS

## 2011-10-31 MED ORDER — ACETAMINOPHEN 160 MG/5ML PO SOLN: 650.0000 mg | ORAL | Status: DC | PRN

## 2011-10-31 MED ORDER — FENTANYL CITRATE 0.05 MG/ML IJ SOLN
INTRAMUSCULAR | Status: DC | PRN
  Administered 2011-10-31 (×4): 50 ug via INTRAVENOUS

## 2011-10-31 MED ORDER — ONDANSETRON HCL 4 MG/2ML IJ SOLN
4.0000 mg | INTRAMUSCULAR | Status: DC | PRN
  Administered 2011-10-31: 4 mg via INTRAVENOUS

## 2011-10-31 MED ORDER — DEXAMETHASONE SODIUM PHOSPHATE 4 MG/ML IJ SOLN
INTRAMUSCULAR | Status: DC | PRN
  Administered 2011-10-31: 10 mg via INTRAVENOUS

## 2011-10-31 MED ORDER — LACTATED RINGERS IV SOLN
INTRAVENOUS | Status: DC
  Administered 2011-10-31 (×2): via INTRAVENOUS

## 2011-10-31 MED ORDER — DEXAMETHASONE SODIUM PHOSPHATE 10 MG/ML IJ SOLN
10.0000 mg | Freq: Once | INTRAMUSCULAR | Status: AC
  Administered 2011-10-31: 10 mg via INTRAVENOUS

## 2011-10-31 MED ORDER — ONDANSETRON HCL 4 MG/2ML IJ SOLN
4.0000 mg | Freq: Once | INTRAMUSCULAR | Status: AC | PRN
  Administered 2011-10-31: 4 mg via INTRAVENOUS

## 2011-10-31 SURGICAL SUPPLY — 35 items
CANISTER SUCTION 1200CC (MISCELLANEOUS) ×2 IMPLANT
CATH ROBINSON RED A/P 10FR (CATHETERS) IMPLANT
CATH ROBINSON RED A/P 12FR (CATHETERS) ×2 IMPLANT
CLEANER CAUTERY TIP 5X5 PAD (MISCELLANEOUS) IMPLANT
CLOTH BEACON ORANGE TIMEOUT ST (SAFETY) ×2 IMPLANT
COAGULATOR SUCT SWTCH 10FR 6 (ELECTROSURGICAL) ×2 IMPLANT
COVER MAYO STAND STRL (DRAPES) ×2 IMPLANT
ELECT COATED BLADE 2.86 ST (ELECTRODE) ×2 IMPLANT
ELECT REM PT RETURN 9FT ADLT (ELECTROSURGICAL) ×2
ELECT REM PT RETURN 9FT PED (ELECTROSURGICAL)
ELECTRODE REM PT RETRN 9FT PED (ELECTROSURGICAL) IMPLANT
ELECTRODE REM PT RTRN 9FT ADLT (ELECTROSURGICAL) ×1 IMPLANT
GAUZE SPONGE 4X4 12PLY STRL LF (GAUZE/BANDAGES/DRESSINGS) ×2 IMPLANT
GLOVE BIO SURGEON STRL SZ 6.5 (GLOVE) ×2 IMPLANT
GLOVE BIOGEL M 7.0 STRL (GLOVE) ×2 IMPLANT
GLOVE BIOGEL PI IND STRL 7.0 (GLOVE) ×1 IMPLANT
GLOVE BIOGEL PI INDICATOR 7.0 (GLOVE) ×1
GOWN PREVENTION PLUS XLARGE (GOWN DISPOSABLE) ×4 IMPLANT
GOWN PREVENTION PLUS XXLARGE (GOWN DISPOSABLE) IMPLANT
MARKER SKIN DUAL TIP RULER LAB (MISCELLANEOUS) IMPLANT
NS IRRIG 1000ML POUR BTL (IV SOLUTION) ×2 IMPLANT
PAD CLEANER CAUTERY TIP 5X5 (MISCELLANEOUS)
PENCIL BUTTON HOLSTER BLD 10FT (ELECTRODE) ×2 IMPLANT
PIN SAFETY STERILE (MISCELLANEOUS) ×2 IMPLANT
SHEET MEDIUM DRAPE 40X70 STRL (DRAPES) ×2 IMPLANT
SOLUTION BUTLER CLEAR DIP (MISCELLANEOUS) IMPLANT
SPONGE TONSIL 1 RF SGL (DISPOSABLE) IMPLANT
SPONGE TONSIL 1.25 RF SGL STRG (GAUZE/BANDAGES/DRESSINGS) IMPLANT
SYR BULB 3OZ (MISCELLANEOUS) ×2 IMPLANT
TOWEL OR 17X24 6PK STRL BLUE (TOWEL DISPOSABLE) ×2 IMPLANT
TUBE CONNECTING 20X1/4 (TUBING) ×2 IMPLANT
TUBE SALEM SUMP 12R W/ARV (TUBING) IMPLANT
TUBE SALEM SUMP 16 FR W/ARV (TUBING) ×2 IMPLANT
WATER STERILE IRR 1000ML POUR (IV SOLUTION) ×2 IMPLANT
YANKAUER SUCT BULB TIP NO VENT (SUCTIONS) ×2 IMPLANT

## 2011-10-31 NOTE — Anesthesia Preprocedure Evaluation (Addendum)
Anesthesia Evaluation  Patient identified by MRN, date of birth, ID band Patient awake    Reviewed: Allergy & Precautions, H&P , NPO status , Patient's Chart, lab work & pertinent test results  Airway Mallampati: II TM Distance: >3 FB Neck ROM: Full    Dental  (+) Teeth Intact and Dental Advisory Given   Pulmonary  breath sounds clear to auscultation        Cardiovascular Rhythm:Regular Rate:Normal     Neuro/Psych    GI/Hepatic GERD-  Medicated and Controlled,  Endo/Other    Renal/GU      Musculoskeletal   Abdominal   Peds  Hematology   Anesthesia Other Findings   Reproductive/Obstetrics                          Anesthesia Physical Anesthesia Plan  ASA: II  Anesthesia Plan: General   Post-op Pain Management:    Induction: Intravenous  Airway Management Planned: Oral ETT  Additional Equipment:   Intra-op Plan:   Post-operative Plan: Extubation in OR  Informed Consent: I have reviewed the patients History and Physical, chart, labs and discussed the procedure including the risks, benefits and alternatives for the proposed anesthesia with the patient or authorized representative who has indicated his/her understanding and acceptance.   Dental advisory given  Plan Discussed with: CRNA, Anesthesiologist and Surgeon  Anesthesia Plan Comments:         Anesthesia Quick Evaluation  

## 2011-10-31 NOTE — Transfer of Care (Signed)
Immediate Anesthesia Transfer of Care Note  Patient: Mary Mcclure  Procedure(s) Performed: Procedure(s) (LRB) with comments: TONSILLECTOMY (Bilateral)  Patient Location: PACU  Anesthesia Type: General  Level of Consciousness: awake, alert , oriented and patient cooperative  Airway & Oxygen Therapy: Patient Spontanous Breathing and Patient connected to face mask oxygen  Post-op Assessment: Report given to PACU RN and Post -op Vital signs reviewed and stable  Post vital signs: Reviewed and stable  Complications: No apparent anesthesia complications

## 2011-10-31 NOTE — Anesthesia Postprocedure Evaluation (Signed)
  Anesthesia Post-op Note  Patient: Mary Mcclure  Procedure(s) Performed: Procedure(s) (LRB) with comments: TONSILLECTOMY (Bilateral)  Patient Location: PACU  Anesthesia Type: General  Level of Consciousness: awake, alert  and oriented  Airway and Oxygen Therapy: Patient Spontanous Breathing and Patient connected to face mask oxygen  Post-op Pain: moderate  Post-op Assessment: Post-op Vital signs reviewed  Post-op Vital Signs: Reviewed  Complications: No apparent anesthesia complications

## 2011-10-31 NOTE — Brief Op Note (Signed)
10/31/2011  8:14 AM  PATIENT:  Mary Mcclure  26 y.o. female  PRE-OPERATIVE DIAGNOSIS:  Chronic tonsillitis  POST-OPERATIVE DIAGNOSIS:  Chronic tonsillitis  PROCEDURE:  Procedure(s) (LRB) with comments: TONSILLECTOMY (Bilateral)  SURGEON:  Surgeon(s) and Role:    * Osborn Coho, MD - Primary  PHYSICIAN ASSISTANT:   ASSISTANTS: none   ANESTHESIA:   general  EBL:  Total I/O In: 900 [I.V.:900] Out: - Min  BLOOD ADMINISTERED:none  DRAINS: none   LOCAL MEDICATIONS USED:  NONE  SPECIMEN:  Source of Specimen:  Tonsils  DISPOSITION OF SPECIMEN:  PATHOLOGY  COUNTS:  YES  TOURNIQUET:  * No tourniquets in log *  DICTATION: .Other Dictation: Dictation Number [id number]  PLAN OF CARE: Admit for overnight observation  PATIENT DISPOSITION:  PACU - hemodynamically stable.   Delay start of Pharmacological VTE agent (>24hrs) due to surgical blood loss or risk of bleeding: not applicable

## 2011-10-31 NOTE — H&P (Signed)
Mary Mcclure is an 26 y.o. female.   Chief Complaint: recurrent tonsillitis HPI: pt with recurrent ST and infection  Past Medical History  Diagnosis Date  . Hyperthyroidism     T 4 a little elevated just found out    History reviewed. No pertinent past surgical history.  Family History  Problem Relation Age of Onset  . Hypertension Father   . GER disease Father   . Cancer Maternal Grandmother     LUNG   Social History:  reports that she has never smoked. She does not have any smokeless tobacco history on file. She reports that she drinks alcohol. She reports that she does not use illicit drugs.  Allergies: No Known Allergies  Medications Prior to Admission  Medication Sig Dispense Refill  . Cranberry 125 MG TABS Take by mouth.      . Norgestimate-Ethinyl Estradiol Triphasic (ORTHO TRI-CYCLEN, 28,) 0.18/0.215/0.25 MG-35 MCG tablet Take 1 tablet by mouth daily.        . Probiotic Product (PROBIOTIC DAILY PO) Take by mouth.        Results for orders placed during the hospital encounter of 10/31/11 (from the past 48 hour(s))  POCT HEMOGLOBIN-HEMACUE     Status: Abnormal   Collection Time   10/31/11  7:10 AM      Component Value Range Comment   Hemoglobin 11.5 (*) 12.0 - 15.0 g/dL    No results found.  Review of Systems  Constitutional: Negative.   Respiratory: Negative.   Cardiovascular: Negative.   Gastrointestinal: Negative.   Skin: Negative.   Neurological: Negative.     Blood pressure 101/69, pulse 70, temperature 97.6 F (36.4 C), temperature source Oral, resp. rate 16, height 5\' 6"  (1.676 m), weight 60.782 kg (134 lb), last menstrual period 10/03/2011, SpO2 99.00%. Physical Exam  Constitutional: She is oriented to person, place, and time. She appears well-developed and well-nourished.  HENT:       2+ cryptic tonsils  Neck: Normal range of motion. Neck supple.  Cardiovascular: Normal rate and regular rhythm.   Respiratory: Effort normal and breath sounds normal.   GI: Soft.  Musculoskeletal: Normal range of motion.  Neurological: She is alert and oriented to person, place, and time.     Assessment/Plan Adm for op T & A, OWER post op  Mary Mcclure 10/31/2011, 7:31 AM

## 2011-10-31 NOTE — Anesthesia Procedure Notes (Signed)
Procedure Name: Intubation Date/Time: 10/31/2011 7:45 AM Performed by: Verlan Friends Pre-anesthesia Checklist: Patient identified, Emergency Drugs available, Suction available, Patient being monitored and Timeout performed Patient Re-evaluated:Patient Re-evaluated prior to inductionOxygen Delivery Method: Circle System Utilized Preoxygenation: Pre-oxygenation with 100% oxygen Intubation Type: IV induction Ventilation: Mask ventilation without difficulty Laryngoscope Size: Miller and 3 Grade View: Grade I Tube type: Oral Tube size: 7.0 mm Number of attempts: 1 Airway Equipment and Method: stylet and oral airway Placement Confirmation: ETT inserted through vocal cords under direct vision,  positive ETCO2 and breath sounds checked- equal and bilateral Secured at: 21 cm Tube secured with: Tape Dental Injury: Teeth and Oropharynx as per pre-operative assessment

## 2011-11-03 ENCOUNTER — Encounter (HOSPITAL_BASED_OUTPATIENT_CLINIC_OR_DEPARTMENT_OTHER): Payer: Self-pay | Admitting: Otolaryngology

## 2011-11-03 NOTE — Op Note (Signed)
NAMEBRADY, PLANT                 ACCOUNT NO.:  [id number]  MEDICAL RECORD NO.:  [id number]  LOCATION:                                 FACILITY:  PHYSICIAN:  Kinnie Scales. Annalee Genta, M.D.DATE OF BIRTH:  [birth year]  DATE OF PROCEDURE:  10/31/2011 DATE OF DISCHARGE:                              OPERATIVE REPORT   PREOPERATIVE DIAGNOSES: 1. Recurrent acute tonsillitis. 2. Chronic cryptic tonsillitis.  POSTOPERATIVE DIAGNOSES: 1. Recurrent acute tonsillitis. 2. Chronic cryptic tonsillitis.  INDICATIONS FOR SURGERY: 1. Recurrent acute tonsillitis. 2. Chronic cryptic tonsillitis.  SURGICAL PROCEDURE:  Tonsillectomy.  ANESTHESIA:  General endotracheal.  SURGEON:  Kinnie Scales. Annalee Genta, MD  COMPLICATIONS:  None.  BLOOD LOSS:  Minimal.  The patient transferred from the operating room to recovery room in stable condition.  BRIEF HISTORY:  The patient is a 26 year old white female, referred to our office with a history of recurrent acute tonsillitis and chronic cryptic tonsillar changes with halitosis and discharge.  Given the patient's history, examination, and physical findings, I recommended tonsillectomy.  The risks and benefits of the procedure were discussed in detail with the patient and her mother.  They understood and concurred with our plan for surgery which is scheduled as an outpatient under general anesthesia on October 31, 2011.  PROCEDURE:  The patient was brought to the operating room, placed in supine position on the operating table.  General endotracheal anesthesia was established without difficulty.  When the patient adequately anesthetized, she was positioned on the operating table, prepped and draped in sterile fashion.  Crowe-Davis mouth gag was inserted without difficulty.  No loose or broken teeth.  Hard and soft palate intact. The oropharynx was examined, nasopharynx examined, minimal adenoid tissue.  No adenoidectomy was performed.  Tonsillectomy was  performed beginning on the left-hand side, dissecting in subcapsular fashion with Bovie electrocautery.  The entire left tonsil was removed from superior pole of the tongue base.  Right tonsil was removed in similar fashion. The tonsil tissue was sent to Pathology for gross microscopic evaluation.  The tonsillar fossa were gently abraded with a dry tonsil sponge and several small areas of point hemorrhage were cauterized with suction cautery.  The mouth gag was released and reapplied.  There was no active bleeding.  Orogastric tube was passed.  Stomach contents aspirated.  Mouth gag was released and removed.  Again no loose or broken teeth, no bleeding.  The patient was awakened from anesthetic, extubated, and then transferred from the operating room to the recovery room in stable condition.  No complications.  Blood loss minimal.          ______________________________ Kinnie Scales. Annalee Genta, M.D.     DLS/MEDQ  D:  14/78/2956  T:  10/31/2011  Job:  [id number]

## 2012-08-19 ENCOUNTER — Emergency Department: Payer: Self-pay | Admitting: Emergency Medicine

## 2012-08-20 ENCOUNTER — Emergency Department: Payer: Self-pay | Admitting: Emergency Medicine

## 2012-08-20 LAB — COMPREHENSIVE METABOLIC PANEL WITH GFR
Albumin: 3.7 g/dL (ref 3.4–5.0)
Alkaline Phosphatase: 47 U/L — ABNORMAL LOW (ref 50–136)
Anion Gap: 9 (ref 7–16)
BUN: 12 mg/dL (ref 7–18)
Bilirubin,Total: 0.3 mg/dL (ref 0.2–1.0)
Calcium, Total: 9.5 mg/dL (ref 8.5–10.1)
Chloride: 108 mmol/L — ABNORMAL HIGH (ref 98–107)
Co2: 21 mmol/L (ref 21–32)
Creatinine: 0.63 mg/dL (ref 0.60–1.30)
EGFR (African American): >60
EGFR (Non-African Amer.): >60
Glucose: 100 mg/dL — ABNORMAL HIGH (ref 65–99)
Osmolality: 276 (ref 275–301)
Potassium: 3.9 mmol/L (ref 3.5–5.1)
SGOT(AST): 36 U/L (ref 15–37)
SGPT (ALT): 27 U/L (ref 12–78)
Sodium: 138 mmol/L (ref 136–145)
Total Protein: 7.6 g/dL (ref 6.4–8.2)

## 2012-08-20 LAB — TROPONIN I: Troponin-I: <0.02 ng/mL

## 2012-08-20 LAB — CBC
HCT: 41.3 % (ref 35.0–47.0)
HGB: 14.3 g/dL (ref 12.0–16.0)
MCH: 31.2 pg (ref 26.0–34.0)
MCHC: 34.7 g/dL (ref 32.0–36.0)
MCV: 90 fL (ref 80–100)
Platelet: 203 x10 3/mm 3 (ref 150–440)
RBC: 4.58 X10 6/mm 3 (ref 3.80–5.20)
RDW: 12.2 % (ref 11.5–14.5)
WBC: 13.1 x10 3/mm 3 — ABNORMAL HIGH (ref 3.6–11.0)

## 2012-08-20 LAB — CK TOTAL AND CKMB (NOT AT ARMC)
CK, Total: 467 U/L — ABNORMAL HIGH (ref 21–215)
CK-MB: <0.5 ng/mL — ABNORMAL LOW (ref 0.5–3.6)

## 2014-07-11 ENCOUNTER — Encounter: Payer: Self-pay | Admitting: Gastroenterology

## 2015-11-21 ENCOUNTER — Encounter: Payer: Self-pay | Admitting: Obstetrics and Gynecology

## 2016-02-07 ENCOUNTER — Encounter: Payer: Self-pay | Admitting: Obstetrics and Gynecology

## 2016-07-21 ENCOUNTER — Ambulatory Visit: Payer: BC Managed Care – PPO | Admitting: Obstetrics & Gynecology

## 2016-08-11 ENCOUNTER — Ambulatory Visit (INDEPENDENT_AMBULATORY_CARE_PROVIDER_SITE_OTHER): Payer: BC Managed Care – PPO | Admitting: Obstetrics & Gynecology

## 2016-08-11 ENCOUNTER — Encounter: Payer: Self-pay | Admitting: Obstetrics & Gynecology

## 2016-08-11 VITALS — BP 100/60 | Ht 65.0 in | Wt 130.0 lb

## 2016-08-11 DIAGNOSIS — N879 Dysplasia of cervix uteri, unspecified: Secondary | ICD-10-CM | POA: Insufficient documentation

## 2016-08-11 NOTE — Patient Instructions (Signed)

## 2016-08-11 NOTE — Progress Notes (Signed)
  HPI:  Patient is a 31 y.o. G0P0000 presenting for follow up evaluation of abnormal PAP smear in the past.  Her last PAP was over a year ago and was abnormal: ASCUS. She has had a prior colposcopy. Prior biopsies (if done) were CIN2 ecto and CIN3 endo bx; and then LEEP was done with neg margins of HPV effect (no CIN).  PMHx: She  has a past medical history of Hyperthyroidism. Also,  has a past surgical history that includes Tonsillectomy (10/31/2011)., family history includes Cancer in her maternal grandmother; GER disease in her father; Hypertension in her father.,  reports that she has never smoked. She has never used smokeless tobacco. She reports that she drinks alcohol. She reports that she does not use drugs.  She has a current medication list which includes the following prescription(s): amoxicillin-clavulanate, cranberry, fluconazole, hydrocodone-acetaminophen, norgestimate-ethinyl estradiol triphasic, ondansetron, and probiotic product. Also, has No Known Allergies.  Review of Systems  All other systems reviewed and are negative.   Objective: BP 100/60   Ht 5\' 5"  (1.651 m)   Wt 130 lb (59 kg)   LMP 07/28/2016   BMI 21.63 kg/m  Filed Weights   08/11/16 1457  Weight: 130 lb (59 kg)   Body mass index is 21.63 kg/m.  Physical examination Physical Exam  Constitutional: She is oriented to person, place, and time. She appears well-developed and well-nourished. No distress.  Genitourinary: Vagina normal and uterus normal. Pelvic exam was performed with patient supine. There is no rash, tenderness or lesion on the right labia. There is no rash, tenderness or lesion on the left labia. No erythema or bleeding in the vagina. Right adnexum does not display mass and does not display tenderness. Left adnexum does not display mass and does not display tenderness. Cervix does not exhibit motion tenderness, discharge, polyp or nabothian cyst.   Uterus is mobile and midaxial. Uterus is not  enlarged or exhibiting a mass.  Abdominal: Soft. She exhibits no distension. There is no tenderness.  Musculoskeletal: Normal range of motion.  Neurological: She is alert and oriented to person, place, and time. No cranial nerve deficit.  Skin: Skin is warm and dry.  Psychiatric: She has a normal mood and affect.    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  I discussed the grading system of pap smears and HPV high risk viral types.   2. Follow up PAP 6 months, vs intervention if high grade dysplasia identified. 3. Cont OCPs 4. Labs UTD (PCP)  Annamarie MajorPaul Shadae Reino, MD, Merlinda FrederickFACOG [facility], [facility] 08/11/2016  3:19 PM

## 2016-08-14 LAB — PAP IG (IMAGE GUIDED): PAP Smear Comment: 0

## 2017-10-13 ENCOUNTER — Encounter: Payer: Self-pay | Admitting: Obstetrics and Gynecology

## 2017-10-14 ENCOUNTER — Ambulatory Visit (INDEPENDENT_AMBULATORY_CARE_PROVIDER_SITE_OTHER): Payer: BC Managed Care – PPO | Admitting: Obstetrics and Gynecology

## 2017-10-14 ENCOUNTER — Encounter: Payer: Self-pay | Admitting: Obstetrics and Gynecology

## 2017-10-14 VITALS — BP 108/72 | HR 76 | Ht 65.0 in | Wt 130.2 lb

## 2017-10-14 DIAGNOSIS — B977 Papillomavirus as the cause of diseases classified elsewhere: Secondary | ICD-10-CM | POA: Diagnosis not present

## 2017-10-14 DIAGNOSIS — Z87898 Personal history of other specified conditions: Secondary | ICD-10-CM

## 2017-10-14 DIAGNOSIS — Z9889 Other specified postprocedural states: Secondary | ICD-10-CM | POA: Diagnosis not present

## 2017-10-14 DIAGNOSIS — Z8742 Personal history of other diseases of the female genital tract: Secondary | ICD-10-CM

## 2017-10-14 NOTE — Progress Notes (Signed)
    GYNECOLOGY PROGRESS NOTE  Subjective:    Patient ID: Mary Mcclure, female    DOB: [birth year], 32 y.o.   MRN: [id number]  HPI  Patient is a 32 y.o. G0P0000 female who presents as a referral from [facility](Cheryl Lindley, FNP) for colposcopy.  Patient has a history of abnormal pap smears in 2017 (ASCUS), followed by a LEEP (for biopsies CIN II/III).  Subsequent pap smear in 07/2016 was negative.  Most recent pap smear in  03/2017 was NILM, but HR HPV positive (however negative for types 16/18/45). She denies complaints today.     Past Medical History:  Diagnosis Date  . Hyperthyroidism    T 4 a little elevated just found out    Family History  Problem Relation Age of Onset  . Hypertension Father   . GER disease Father   . Cancer Maternal Grandmother        LUNG  . Healthy Mother     Past Surgical History:  Procedure Laterality Date  . LEEP    . TONSILLECTOMY  10/31/2011   Procedure: TONSILLECTOMY;  Surgeon: Osborn Cohoavid Shoemaker, MD;  Location: [facility];  Service: ENT;  Laterality: Bilateral;    Social History   Tobacco Use  . Smoking status: Never Smoker  . Smokeless tobacco: Never Used  Substance Use Topics  . Alcohol use: Not Currently    Comment: couple times a month  . Drug use: No     Current Outpatient Medications on File Prior to Visit  Medication Sig Dispense Refill  . Norgestimate-Ethinyl Estradiol Triphasic (ORTHO TRI-CYCLEN, 28,) 0.18/0.215/0.25 MG-35 MCG tablet Take 1 tablet by mouth daily.       No current facility-administered medications on file prior to visit.     No Known Allergies   Review of Systems A comprehensive review of systems was negative.   Objective:   Blood pressure 108/72, pulse 76, height 5\' 5"  (1.651 m), weight 130 lb 3.2 oz (59.1 kg), last menstrual period 10/07/2017. General appearance: alert and no distress Remainder of exam deferred.    Assessment:   HPV infection History of abnormal pap  smear H/o LEEP  Plan:   Discussion had with patient regarding current pap smear results.  She currently has a negative pap smear, HR HPV +, with negative co-testing for HR HPV types of 16/18/45.  Based on ASCCP guidelines, with negative DNA typing for high risk types of 16/18/45, she can repeat pap smear in 1 year.  Discussed recommendations with patient. Reiterated the importance of continued close surveillance due to prior history of CIN II/III requiring a LEEP. Patient notes understanding. Can schedule pap smear in March with PCP or GYN.   Hildred Laserherry, Lollie Gunner, MD [facility]

## 2017-10-14 NOTE — Progress Notes (Signed)
   PT is present today for a colpo. Pt stated that she is doing well no complaints.   

## 2018-09-02 ENCOUNTER — Other Ambulatory Visit: Payer: Self-pay

## 2018-09-02 ENCOUNTER — Ambulatory Visit: Payer: BC Managed Care – PPO | Admitting: Neurology

## 2018-09-02 ENCOUNTER — Encounter: Payer: Self-pay | Admitting: Neurology

## 2018-09-02 VITALS — BP 118/82 | HR 94 | Temp 98.8°F | Ht 65.0 in | Wt 131.0 lb

## 2018-09-02 DIAGNOSIS — G43E09 Chronic migraine with aura, not intractable, without status migrainosus: Secondary | ICD-10-CM

## 2018-09-02 DIAGNOSIS — Z87898 Personal history of other specified conditions: Secondary | ICD-10-CM

## 2018-09-02 DIAGNOSIS — G43109 Migraine with aura, not intractable, without status migrainosus: Secondary | ICD-10-CM | POA: Diagnosis not present

## 2018-09-02 NOTE — Progress Notes (Signed)
PATIENT: Mary Mcclure DOB: 1985/03/15  Chief Complaint  Patient presents with  . New Patient (Initial Visit)    Paper referral for seizures and migraine aura  room in back hallway took seizures medications, last took vimpat a year ago,     HISTORICAL  Mary Mcclure is a 33 year old female, seen in request by her primary care physician Dr. Ermalene SearingBedsole, Amy for evaluation of seizure, migraine with visual aura, initial evaluation was on September 02, 2018.  I have reviewed and summarized the referring note from the referring physician.  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, it was contributed to combination of alcohol and excessive use of red bull combination, she reported normal MRI of the brain, EEG, but was treated with antiepileptic medication for few years, initially was Keppra, later switched to Vimpat, even before that generalized tonic-clonic seizure, she had occasionally spells of sudden onset feeling hot, throwing up, but there was no loss of consciousness, was told the possibility of partial seizure, but it was usually triggered by hot shower, it was happening every 2 to 3 months  She stopped having the spells, she has side effect to antiepileptic medications, make her dizzy, sleepy, she stopped using them for many years  She did have a history of migraine headaches, noticed frequent occurrence of migraine with aura since she started workout in May 2020, with moderate strenuous workout, almost after each session, she began to noticed visual auras, flashing light in her visual field, zigzag, lasting for 30 minutes, followed by mild to moderate bilateral frontal temporal pressure headaches, with mild light noise sensitivity, sometimes nausea, she occasionally taking ibuprofen that was helpful  Today her main concerns to rule out structural lesion of her brain, because of frequent visual auras, she has to create a workout her migraine headaches are mild to  moderate,   REVIEW OF SYSTEMS: Full 14 system review of systems performed and notable only for as above All other review of systems were negative.  ALLERGIES: No Known Allergies  HOME MEDICATIONS: Current Outpatient Medications  Medication Sig Dispense Refill  . Norgestimate-Ethinyl Estradiol Triphasic (ORTHO TRI-CYCLEN, 28,) 0.18/0.215/0.25 MG-35 MCG tablet Take 1 tablet by mouth daily.       No current facility-administered medications for this visit.     PAST MEDICAL HISTORY: Past Medical History:  Diagnosis Date  . Hyperthyroidism    T 4 a little elevated just found out    PAST SURGICAL HISTORY: Past Surgical History:  Procedure Laterality Date  . LEEP    . TONSILLECTOMY  10/31/2011   Procedure: TONSILLECTOMY;  Surgeon: Osborn Cohoavid Shoemaker, MD;  Location: [facility];  Service: ENT;  Laterality: Bilateral;    FAMILY HISTORY: Family History  Problem Relation Age of Onset  . Hypertension Father   . GER disease Father   . Cancer Maternal Grandmother        LUNG  . Healthy Mother     SOCIAL HISTORY: Social History   Socioeconomic History  . Marital status: Single    Spouse name: Not on file  . Number of children: 1  . Years of education: Not on file  . Highest education level: Not on file  Occupational History  . Occupation: nursing school  Social Needs  . Financial resource strain: Not on file  . Food insecurity    Worry: Not on file    Inability: Not on file  . Transportation needs    Medical: Not on file  Non-medical: Not on file  Tobacco Use  . Smoking status: Never Smoker  . Smokeless tobacco: Never Used  Substance and Sexual Activity  . Alcohol use: Not Currently    Comment: couple times a month  . Drug use: No  . Sexual activity: Yes    Birth control/protection: Pill  Lifestyle  . Physical activity    Days per week: Not on file    Minutes per session: Not on file  . Stress: Not on file  Relationships  . Social Wellsite geologistconnections     Talks on phone: Not on file    Gets together: Not on file    Attends religious service: Not on file    Active member of club or organization: Not on file    Attends meetings of clubs or organizations: Not on file    Relationship status: Not on file  . Intimate partner violence    Fear of current or ex partner: Not on file    Emotionally abused: Not on file    Physically abused: Not on file    Forced sexual activity: Not on file  Other Topics Concern  . Not on file  Social History Narrative   Regular Exercise yes, 3-4 days a week goes to gym      Diet: fruits and veggies, water, no soda, minimal caffeiene      PHYSICAL EXAM   Vitals:   09/02/18 1400  BP: 118/82  Pulse: 94  Temp: 98.8 F (37.1 C)  Weight: 131 lb (59.4 kg)  Height: 5\' 5"  (1.651 m)    Not recorded      Body mass index is 21.8 kg/m.  PHYSICAL EXAMNIATION:  Gen: NAD, conversant, well nourised, obese, well groomed                     Cardiovascular: Regular rate rhythm, no peripheral edema, warm, nontender. Eyes: Conjunctivae clear without exudates or hemorrhage Neck: Supple, no carotid bruits. Pulmonary: Clear to auscultation bilaterally   NEUROLOGICAL EXAM:  MENTAL STATUS: Speech:    Speech is normal; fluent and spontaneous with normal comprehension.  Cognition:     Orientation to time, place and person     Normal recent and remote memory     Normal Attention span and concentration     Normal Language, naming, repeating,spontaneous speech     Fund of knowledge   CRANIAL NERVES: CN II: Visual fields are full to confrontation.  Pupils are round equal and briskly reactive to light. CN III, IV, VI: extraocular movement are normal. No ptosis. CN V: Facial sensation is intact to pinprick in all 3 divisions bilaterally. Corneal responses are intact.  CN VII: Face is symmetric with normal eye closure and smile. CN VIII: Hearing is normal to rubbing fingers CN IX, X: Palate elevates symmetrically.  Phonation is normal. CN XI: Head turning and shoulder shrug are intact CN XII: Tongue is midline with normal movements and no atrophy.  MOTOR: There is no pronator drift of out-stretched arms. Muscle bulk and tone are normal. Muscle strength is normal.  REFLEXES: Reflexes are 2+ and symmetric at the biceps, triceps, knees, and ankles. Plantar responses are flexor.  SENSORY: Intact to light touch, pinprick, positional sensation and vibratory sensation are intact in fingers and toes.  COORDINATION: Rapid alternating movements and fine finger movements are intact. There is no dysmetria on finger-to-nose and heel-knee-shin.    GAIT/STANCE: Posture is normal. Gait is steady with normal steps, base, arm swing, and turning.  Heel and toe walking are normal. Tandem gait is normal.  Romberg is absent.   DIAGNOSTIC DATA (LABS, IMAGING, TESTING) - I reviewed patient records, labs, notes, testing and imaging myself where available.   ASSESSMENT AND PLAN  Mary Mcclure is a 33 y.o. female   Chronic migraine headache with aura  Complete evaluation with MRI of brain  EEG  NSAIDs as needed for abortive treatment   I have suggested magnesium oxide 400 mg, riboflavin 100mg  twice a day as preventive medications  Marcial Pacas, M.D. Ph.D.  [facility] [address] [place], [place] [address] Ph: [phone] Fax: [phone]  CC: Jinny Sanders, MD

## 2018-09-02 NOTE — Patient Instructions (Signed)
Magnesium oxide 400mg  and riboflavin 100mg  twice a day as migraine prevention  Aleve as needed.

## 2018-09-07 ENCOUNTER — Telehealth: Payer: Self-pay | Admitting: Neurology

## 2018-09-07 NOTE — Telephone Encounter (Signed)
LVM for pt to call me back about scheduling mri  [employer] Auth: [id number] (exp. 09/07/18 to 03/05/19)

## 2018-09-23 ENCOUNTER — Other Ambulatory Visit: Payer: BC Managed Care – PPO

## 2019-01-26 ENCOUNTER — Other Ambulatory Visit: Payer: Self-pay | Admitting: Family Medicine

## 2019-01-26 ENCOUNTER — Other Ambulatory Visit: Payer: Self-pay | Admitting: Emergency Medicine

## 2019-01-26 DIAGNOSIS — R002 Palpitations: Secondary | ICD-10-CM

## 2019-01-26 DIAGNOSIS — R0602 Shortness of breath: Secondary | ICD-10-CM

## 2019-01-31 ENCOUNTER — Other Ambulatory Visit: Payer: Self-pay

## 2019-01-31 ENCOUNTER — Ambulatory Visit
Admission: RE | Admit: 2019-01-31 | Discharge: 2019-01-31 | Disposition: A | Discharge status: Home / Self Care | Payer: BC Managed Care – PPO | Source: Ambulatory Visit | Attending: Family Medicine | Admitting: Family Medicine

## 2019-01-31 DIAGNOSIS — R002 Palpitations: Secondary | ICD-10-CM | POA: Diagnosis present

## 2019-01-31 DIAGNOSIS — R0602 Shortness of breath: Secondary | ICD-10-CM | POA: Diagnosis not present

## 2019-03-21 ENCOUNTER — Telehealth: Payer: Self-pay | Admitting: Obstetrics and Gynecology

## 2019-03-21 NOTE — Telephone Encounter (Signed)
Noted. Paragard reserved for this patient. 

## 2019-03-21 NOTE — Telephone Encounter (Signed)
Patient scheduled for [facility] consult (interested in Paragard), 3/1 with [facility].

## 2019-03-28 ENCOUNTER — Other Ambulatory Visit: Payer: Self-pay

## 2019-03-28 ENCOUNTER — Other Ambulatory Visit (HOSPITAL_COMMUNITY)
Admission: RE | Admit: 2019-03-28 | Discharge: 2019-03-28 | Disposition: A | Discharge status: Home / Self Care | Payer: BC Managed Care – PPO | Source: Ambulatory Visit | Attending: Obstetrics and Gynecology | Admitting: Obstetrics and Gynecology

## 2019-03-28 ENCOUNTER — Encounter: Payer: Self-pay | Admitting: Obstetrics and Gynecology

## 2019-03-28 ENCOUNTER — Ambulatory Visit (INDEPENDENT_AMBULATORY_CARE_PROVIDER_SITE_OTHER): Payer: BC Managed Care – PPO | Admitting: Obstetrics and Gynecology

## 2019-03-28 VITALS — BP 100/60 | Ht 65.0 in | Wt 134.0 lb

## 2019-03-28 DIAGNOSIS — Z30430 Encounter for insertion of intrauterine contraceptive device: Secondary | ICD-10-CM | POA: Diagnosis not present

## 2019-03-28 DIAGNOSIS — Z113 Encounter for screening for infections with a predominantly sexual mode of transmission: Secondary | ICD-10-CM | POA: Insufficient documentation

## 2019-03-28 MED ORDER — PARAGARD INTRAUTERINE COPPER IU IUD: INTRAUTERINE_SYSTEM | Freq: Once | INTRAUTERINE | Status: DC

## 2019-03-28 NOTE — Patient Instructions (Signed)
I value your feedback and entrusting us with your care. If you get a [facility] patient survey, I would appreciate you taking the time to let us know about your experience today. Thank you!  As of January 06, 2019, your lab results will be released to your MyChart immediately, before I even have a chance to see them. Please give me time to review them and contact you if there are any abnormalities. Thank you for your patience.  

## 2019-03-28 NOTE — Progress Notes (Signed)
Mary Bend, NP   Chief Complaint  Patient presents with  . Contraception    HPI:      [name] is a 34 y.o. G0P0000 who LMP was Patient's last menstrual period was 03/22/2019., presents today for Paragard insertion. Currently on OCPs, no side effects. Menses are monthly, lasting 3-4 days, mild dysmen, no BTB. Never had IUD before. Not sex active currently.  Hx of abn pap/colpo/CIN 2-3 with LEEP. Last pap Neg pap/Neg HPV DNA 3/19 at [facility]; neg pap 2018 in my office. Due after 3 yrs.    Past Medical History:  Diagnosis Date  . Hyperthyroidism    T 4 a little elevated just found out    Past Surgical History:  Procedure Laterality Date  . LEEP    . TONSILLECTOMY  10/31/2011   Procedure: TONSILLECTOMY;  Surgeon: Osborn Coho, MD;  Location: [facility];  Service: ENT;  Laterality: Bilateral;    Family History  Problem Relation Age of Onset  . Hypertension Father   . GER disease Father   . Cancer Maternal Grandmother        LUNG  . Healthy Mother     Social History   Socioeconomic History  . Marital status: Single    Spouse name: Not on file  . Number of children: 1  . Years of education: Not on file  . Highest education level: Not on file  Occupational History  . Occupation: nursing school  Tobacco Use  . Smoking status: Never Smoker  . Smokeless tobacco: Never Used  Substance and Sexual Activity  . Alcohol use: Not Currently    Comment: couple times a month  . Drug use: No  . Sexual activity: Yes    Birth control/protection: Pill  Other Topics Concern  . Not on file  Social History Narrative   Regular Exercise yes, 3-4 days a week goes to gym      Diet: fruits and veggies, water, no soda, minimal caffeiene    Social Determinants of Health   Financial Resource Strain:   . Difficulty of Paying Living Expenses: Not on file  Food Insecurity:   . Worried About Programme researcher, broadcasting/film/video in the Last Year: Not on file  . Ran  Out of Food in the Last Year: Not on file  Transportation Needs:   . Lack of Transportation (Medical): Not on file  . Lack of Transportation (Non-Medical): Not on file  Physical Activity:   . Days of Exercise per Week: Not on file  . Minutes of Exercise per Session: Not on file  Stress:   . Feeling of Stress : Not on file  Social Connections:   . Frequency of Communication with Friends and Family: Not on file  . Frequency of Social Gatherings with Friends and Family: Not on file  . Attends Religious Services: Not on file  . Active Member of Clubs or Organizations: Not on file  . Attends Banker Meetings: Not on file  . Marital Status: Not on file  Intimate Partner Violence:   . Fear of Current or Ex-Partner: Not on file  . Emotionally Abused: Not on file  . Physically Abused: Not on file  . Sexually Abused: Not on file    Outpatient Medications Prior to Visit  Medication Sig Dispense Refill  . Norgestimate-Ethinyl Estradiol Triphasic (ORTHO TRI-CYCLEN, 28,) 0.18/0.215/0.25 MG-35 MCG tablet Take 1 tablet by mouth daily.       No facility-administered medications  prior to visit.      ROS:  Review of Systems  Constitutional: Negative for fatigue, fever and unexpected weight change.  Respiratory: Negative for cough, shortness of breath and wheezing.   Cardiovascular: Negative for chest pain, palpitations and leg swelling.  Gastrointestinal: Negative for blood in stool, constipation, diarrhea, nausea and vomiting.  Endocrine: Negative for cold intolerance, heat intolerance and polyuria.  Genitourinary: Negative for dyspareunia, dysuria, flank pain, frequency, genital sores, hematuria, menstrual problem, pelvic pain, urgency, vaginal bleeding, vaginal discharge and vaginal pain.  Musculoskeletal: Negative for back pain, joint swelling and myalgias.  Skin: Negative for rash.  Neurological: Negative for dizziness, syncope, light-headedness, numbness and headaches.   Hematological: Negative for adenopathy.  Psychiatric/Behavioral: Negative for agitation, confusion, sleep disturbance and suicidal ideas. The patient is not nervous/anxious.     OBJECTIVE:   Vitals:  BP 100/60   Ht 5\' 5"  (1.651 m)   Wt 134 lb (60.8 kg)   LMP 03/22/2019   BMI 22.30 kg/m   Physical Exam Vitals reviewed.  Constitutional:      Appearance: She is well-developed.  Pulmonary:     Effort: Pulmonary effort is normal.  Genitourinary:    General: Normal vulva.     Pubic Area: No rash.      Labia:        Right: No rash, tenderness or lesion.        Left: No rash, tenderness or lesion.      Vagina: Normal. No vaginal discharge, erythema or tenderness.     Cervix: Normal.  Musculoskeletal:        General: Normal range of motion.     Cervical back: Normal range of motion.  Skin:    General: Skin is warm and dry.  Neurological:     General: No focal deficit present.     Mental Status: She is alert and oriented to person, place, and time.  Psychiatric:        Mood and Affect: Mood normal.        Behavior: Behavior normal.        Thought Content: Thought content normal.        Judgment: Judgment normal.    IUD Insertion Procedure Note Patient identified, informed consent performed, consent signed.   Discussed risks of irregular bleeding, cramping, infection, malpositioning or misplacement of the IUD outside the uterus which may require further procedure such as laparoscopy, risk of failure <1%. Time out was performed.    Speculum placed in the vagina.  Cervix visualized.  Cleaned with Betadine x 2.  Grasped anteriorly with a single tooth tenaculum.  Uterus sounded to 7.0 cm.   IUD placed per manufacturer's recommendations.  Strings trimmed to 3 cm. Tenaculum was removed, good hemostasis noted.  Patient tolerated procedure well.   Assessment/Plan: Encounter for insertion of intrauterine contraceptive device (IUD) - Plan: paragard intrauterine copper IUD  Screening  for STD (sexually transmitted disease) - Plan: Cervicovaginal ancillary only   Meds ordered this encounter  Medications  . paragard intrauterine copper IUD   Patient was given post-procedure instructions.  She was advised to have backup contraception for one week.   Call if you are having increasing pain, cramps or bleeding or if you have a fever greater than 100.4 degrees F., shaking chills, nausea or vomiting. Patient was also asked to check IUD strings periodically and follow up in 4 weeks for IUD check.    Return in about 4 weeks (around 04/25/2019) for IUD f/u.  Elmo Putt  B. Chamberlain Steinborn, PA-C 03/28/2019 4:02 PM

## 2019-03-29 ENCOUNTER — Telehealth: Payer: Self-pay

## 2019-03-29 DIAGNOSIS — Z30431 Encounter for routine checking of intrauterine contraceptive device: Secondary | ICD-10-CM

## 2019-03-29 NOTE — Telephone Encounter (Signed)
Spoke w/patient. She took 1000mg  Tylenol when she left here yesterday and was still hurting when she got home so she took 400mg  of Ibuprofen and has taken same doses of Tylenol and Ibuprofen today. Advised Ibuprofen is more likely to help with cramping than tylenol. Can take 800 mg Ibuprofen q6-8 hours per [facility] and try heating pad. If not better with that regimen and still having pain tomorrow should call for apt to be seen and u/s to check for placement.

## 2019-03-29 NOTE — Telephone Encounter (Signed)
Patient reports having severe cramping & shooting pain in right lower abdomen since Paragard was placed yesterday. She expected mild cramping, but not like this. [phone]

## 2019-03-30 LAB — CERVICOVAGINAL ANCILLARY ONLY
Chlamydia: NEGATIVE
Comment: NEGATIVE
Comment: NORMAL
Neisseria Gonorrhea: NEGATIVE

## 2019-04-04 NOTE — Telephone Encounter (Signed)
Paragard rcvd/charged 03/28/2019

## 2019-04-07 NOTE — Telephone Encounter (Signed)
Pt called triage and said she is still having sharp pain on right side and feels bloated 24/7. Per last notes, can you pls put order in for u/s?

## 2019-04-08 NOTE — Telephone Encounter (Signed)
Order placed. Pls sched pt for u/s. I'll call with results.

## 2019-04-08 NOTE — Telephone Encounter (Signed)
Called and left voice mail for patient to call back to be schedule °

## 2019-04-08 NOTE — Addendum Note (Signed)
Addended by: Althea Grimmer B on: 04/08/2019 07:34 AM   Modules accepted: Orders

## 2019-04-11 NOTE — Telephone Encounter (Signed)
Called and left voice mail for patient to call back to be schedule °

## 2019-04-12 NOTE — Telephone Encounter (Signed)
Called and left voice mail for patient to call back to be schedule °

## 2019-04-25 ENCOUNTER — Other Ambulatory Visit (HOSPITAL_COMMUNITY)
Admission: RE | Admit: 2019-04-25 | Discharge: 2019-04-25 | Disposition: A | Discharge status: Home / Self Care | Payer: BC Managed Care – PPO | Source: Ambulatory Visit | Attending: Obstetrics and Gynecology | Admitting: Obstetrics and Gynecology

## 2019-04-25 ENCOUNTER — Other Ambulatory Visit: Payer: Self-pay

## 2019-04-25 ENCOUNTER — Encounter: Payer: Self-pay | Admitting: Obstetrics and Gynecology

## 2019-04-25 ENCOUNTER — Ambulatory Visit (INDEPENDENT_AMBULATORY_CARE_PROVIDER_SITE_OTHER): Payer: BC Managed Care – PPO | Admitting: Obstetrics and Gynecology

## 2019-04-25 VITALS — BP 100/76 | Ht 65.0 in | Wt 130.0 lb

## 2019-04-25 DIAGNOSIS — Z124 Encounter for screening for malignant neoplasm of cervix: Secondary | ICD-10-CM | POA: Diagnosis not present

## 2019-04-25 DIAGNOSIS — Z30431 Encounter for routine checking of intrauterine contraceptive device: Secondary | ICD-10-CM | POA: Diagnosis not present

## 2019-04-25 DIAGNOSIS — Z1151 Encounter for screening for human papillomavirus (HPV): Secondary | ICD-10-CM | POA: Diagnosis not present

## 2019-04-25 DIAGNOSIS — Z9889 Other specified postprocedural states: Secondary | ICD-10-CM | POA: Diagnosis present

## 2019-04-25 NOTE — Progress Notes (Signed)
   Chief Complaint  Patient presents with  . Follow-up    IUD check, no concerns     History of Present Illness:  Mary Mcclure is a 34 y.o. that had a Paragard IUD placed approximately 1 month ago. Since that time, she denies dyspareunia, pelvic pain, non-menstrual bleeding, vaginal d/c, heavy bleeding.   Review of Systems  Constitutional: Negative for fever.  Gastrointestinal: Negative for blood in stool, constipation, diarrhea, nausea and vomiting.  Genitourinary: Negative for dyspareunia, dysuria, flank pain, frequency, hematuria, urgency, vaginal bleeding, vaginal discharge and vaginal pain.  Musculoskeletal: Negative for back pain.  Skin: Negative for rash.    Physical Exam:  BP 100/76   Ht 5\' 5"  (1.651 m)   Wt 130 lb (59 kg)   BMI 21.63 kg/m  Body mass index is 21.63 kg/m.  Pelvic exam:  Two IUD strings present seen coming from the cervical os. EGBUS, vaginal vault and cervix: within normal limits   Assessment:   Encounter for routine checking of intrauterine contraceptive device (IUD)  Cervical cancer screening - Plan: Cytology - PAP  Screening for HPV (human papillomavirus) - Plan: Cytology - PAP  History of loop electrical excision procedure (LEEP) - Plan: Cytology - PAP  IUD strings present in proper location; pt doing well  Plan: F/u if any signs of infection or can no longer feel the strings.   Mary Kinney B. Edge Mauger, PA-C 04/25/2019 3:30 PM

## 2019-04-25 NOTE — Patient Instructions (Signed)
I value your feedback and entrusting us with your care. If you get a [facility] patient survey, I would appreciate you taking the time to let us know about your experience today. Thank you!  As of January 06, 2019, your lab results will be released to your MyChart immediately, before I even have a chance to see them. Please give me time to review them and contact you if there are any abnormalities. Thank you for your patience.  

## 2019-04-27 LAB — CYTOLOGY - PAP
Comment: NEGATIVE
Diagnosis: NEGATIVE
High risk HPV: NEGATIVE

## 2019-06-15 ENCOUNTER — Telehealth: Payer: Self-pay | Admitting: Obstetrics and Gynecology

## 2019-06-15 NOTE — Telephone Encounter (Signed)
That's fine. Can you get her sched for u/s before that appt?

## 2019-06-15 NOTE — Telephone Encounter (Signed)
Patient is calling wanting a follow up on her IUD due to having bloating and pain with intercourse. Patient had selected dates due to work and wished to see anyone. I have patient scheduled with [facility] on Thursday, 06/23/19 at 3:50. Just wanted to let you know since you placed device.

## 2019-06-15 NOTE — Telephone Encounter (Signed)
Patient is schedule for 1 pm 06/23/19 and will follow up with [facility]

## 2019-06-17 ENCOUNTER — Encounter: Payer: Self-pay | Admitting: Obstetrics and Gynecology

## 2019-06-17 ENCOUNTER — Other Ambulatory Visit: Payer: Self-pay

## 2019-06-17 ENCOUNTER — Ambulatory Visit (INDEPENDENT_AMBULATORY_CARE_PROVIDER_SITE_OTHER): Payer: BC Managed Care – PPO

## 2019-06-17 ENCOUNTER — Ambulatory Visit (INDEPENDENT_AMBULATORY_CARE_PROVIDER_SITE_OTHER): Payer: BC Managed Care – PPO | Admitting: Obstetrics and Gynecology

## 2019-06-17 VITALS — BP 122/74 | Wt 128.0 lb

## 2019-06-17 DIAGNOSIS — N946 Dysmenorrhea, unspecified: Secondary | ICD-10-CM | POA: Diagnosis not present

## 2019-06-17 DIAGNOSIS — Z975 Presence of (intrauterine) contraceptive device: Secondary | ICD-10-CM

## 2019-06-17 DIAGNOSIS — Z30431 Encounter for routine checking of intrauterine contraceptive device: Secondary | ICD-10-CM | POA: Diagnosis not present

## 2019-06-17 DIAGNOSIS — N920 Excessive and frequent menstruation with regular cycle: Secondary | ICD-10-CM

## 2019-06-17 DIAGNOSIS — R140 Abdominal distension (gaseous): Secondary | ICD-10-CM | POA: Diagnosis not present

## 2019-06-17 NOTE — Progress Notes (Signed)
Obstetrics & Gynecology Office Visit   Chief Complaint  Patient presents with  . Follow-up  . Pelvic Pain  Ultrasound for pelvic pain, dysmenorrhea, and bloating with an IUD in place   History of Present Illness: 34 y.o. G0P0000 female who presents in follow up from the placement of her IUD.  She had a Paragard IUD placed at the beginning of March.  She notes nearly-constant bloating for the past two weeks.  She states that her IBS has been worse. She sees a gastroenterologist in about 3 weeks. Her menstrual bleeding has lasted longer.  She states that the cramping has been severe and has radiated into her vagina.  She also notes that in order to treat some of the symptoms of her IBS she has started taking other supplements and she is unsure whether her symptoms are related to her supplements or her IUD.  She had a pelvic ultrasound today that showed the IUD to be in the correct location.  She also notes pain with intercourse. This is with deeper penetration. She has not had pain intercourse prior to the IUD being placed.  She feels the pain in the area where her cervix would be. Her partner notes no discomfort.    She has been sexually active with a newer partner for about a month.  However, she states her issues started prior to this relationship. Declines STD screening.  Past Medical History:  Diagnosis Date  . Hyperthyroidism    T 4 a little elevated just found out  . IBS (irritable bowel syndrome)     Past Surgical History:  Procedure Laterality Date  . LEEP    . TONSILLECTOMY  10/31/2011   Procedure: TONSILLECTOMY;  Surgeon: Osborn Coho, MD;  Location: [facility];  Service: ENT;  Laterality: Bilateral;    Gynecologic History: No LMP recorded. (Menstrual status: IUD).  Obstetric History: G0P0000  Family History  Problem Relation Age of Onset  . Hypertension Father   . GER disease Father   . Cancer Maternal Grandmother        LUNG  . Healthy Mother      Social History   Socioeconomic History  . Marital status: Single    Spouse name: Not on file  . Number of children: 1  . Years of education: Not on file  . Highest education level: Not on file  Occupational History  . Occupation: nursing school  Tobacco Use  . Smoking status: Never Smoker  . Smokeless tobacco: Never Used  Substance and Sexual Activity  . Alcohol use: Not Currently    Comment: couple times a month  . Drug use: No  . Sexual activity: Not Currently    Birth control/protection: I.U.D.    Comment: Paraguard  Other Topics Concern  . Not on file  Social History Narrative   Regular Exercise yes, 3-4 days a week goes to gym      Diet: fruits and veggies, water, no soda, minimal caffeiene    Social Determinants of Health   Financial Resource Strain:   . Difficulty of Paying Living Expenses:   Food Insecurity:   . Worried About Programme researcher, broadcasting/film/video in the Last Year:   . Barista in the Last Year:   Transportation Needs:   . Freight forwarder (Medical):   Marland Kitchen Lack of Transportation (Non-Medical):   Physical Activity:   . Days of Exercise per Week:   . Minutes of Exercise per Session:   Stress:   .  Feeling of Stress :   Social Connections:   . Frequency of Communication with Friends and Family:   . Frequency of Social Gatherings with Friends and Family:   . Attends Religious Services:   . Active Member of Clubs or Organizations:   . Attends Banker Meetings:   Marland Kitchen Marital Status:   Intimate Partner Violence:   . Fear of Current or Ex-Partner:   . Emotionally Abused:   Marland Kitchen Physically Abused:   . Sexually Abused:     No Known Allergies  Prior to Admission medications   Medication Sig Start Date End Date Taking? Authorizing Provider  ketoconazole (NIZORAL) 2 % shampoo TOPICAL WASH HAIR AND APPLY AMPLY AMOUNT 2 3 TIMES PER WEEK. 03/10/19  Yes [provider]    Review of Systems  Constitutional: Negative.   HENT:  Negative.   Eyes: Negative.   Respiratory: Negative.   Cardiovascular: Negative.   Gastrointestinal: Positive for abdominal pain, constipation and diarrhea. Negative for blood in stool, heartburn, melena, nausea and vomiting.  Genitourinary: Negative.        See HPI  Musculoskeletal: Negative.   Skin: Negative.   Neurological: Negative.   Psychiatric/Behavioral: Negative.      Physical Exam BP 122/74   Wt 128 lb (58.1 kg)   BMI 21.30 kg/m  No LMP recorded. (Menstrual status: IUD). Physical Exam Constitutional:      General: She is not in acute distress.    Appearance: Normal appearance.  HENT:     Head: Normocephalic and atraumatic.  Eyes:     General: No scleral icterus.    Conjunctiva/sclera: Conjunctivae normal.  Neurological:     General: No focal deficit present.     Mental Status: She is alert and oriented to person, place, and time.     Cranial Nerves: No cranial nerve deficit.  Psychiatric:        Mood and Affect: Mood normal.        Behavior: Behavior normal.        Judgment: Judgment normal.    Imaging Results US PELVIS TRANSVAGINAL NON-OB (TV ONLY)  Result Date: 06/17/2019 Patient Name: Mary Mcclure DOB: 04-01-1985 MRN: [id number] ULTRASOUND REPORT Location: [facility] Date of Service: 06/17/2019 Indications:Pelvic Pain with IUD Findings: The uterus is anteverted and measures 7.3 x 4.3 x 3.2 cm. Echo texture is homogenous without evidence of focal masses. The Endometrium measures 4.5 mm. The IUD is correctly placed within the uterus. Right Ovary measures 2.9 x 2.2 x 1.9 cm. It is normal in appearance. Left Ovary measures 2.5 x 1.8 x 1.0 cm. It is normal in appearance. Survey of the adnexa demonstrates no adnexal masses. There is no free fluid in the cul de sac. Impression: 1. Normal pelvic ultrasound. 2. The IUD is correctly placed within the uterus. Deanna Artis, RT The ultrasound images and findings were reviewed by me and I agree with the above report.  Thomasene Mohair, MD, [facility], [facility] 06/17/2019 3:41 PM       Assessment: 34 y.o. G0P0000 female here for  1. Dysmenorrhea   2. Bloating   3. Menorrhagia with regular cycle   4. IUD (intrauterine device) in place      Plan: Problem List Items Addressed This Visit    None    Visit Diagnoses    Dysmenorrhea    -  Primary   Bloating       Menorrhagia with regular cycle  IUD (intrauterine device) in place         Discussed that her symptoms could be coming from her new IUD (beginning of March). There are some symptoms which are expected from a Paragard IUD that are typical for the first several months. She notes that her cramping is severe and she is at the point of tears at times.  We discussed options of maintaining her IUD or removing it.  After much thoughtful discussion, she elects to keep her IUD for the present time and will also see her gastroenterologist to see if that can help her decide whether to change her supplements or whether she should remove he IUD. I did offer to remove her IUD, if that was her desire. However, she would like to keep it at this time.    A total of 30 minutes were spent face-to-face with the patient as well as preparation, review, communication, and documentation during this encounter.    Prentice Docker, MD 06/17/2019 3:31 PM

## 2019-06-23 ENCOUNTER — Ambulatory Visit: Payer: BC Managed Care – PPO | Admitting: Obstetrics & Gynecology

## 2019-06-23 ENCOUNTER — Other Ambulatory Visit: Payer: BC Managed Care – PPO

## 2019-09-12 ENCOUNTER — Other Ambulatory Visit: Payer: Self-pay

## 2019-09-12 ENCOUNTER — Encounter: Payer: Self-pay | Admitting: Obstetrics and Gynecology

## 2019-09-12 ENCOUNTER — Ambulatory Visit (INDEPENDENT_AMBULATORY_CARE_PROVIDER_SITE_OTHER): Payer: BC Managed Care – PPO | Admitting: Obstetrics and Gynecology

## 2019-09-12 VITALS — BP 90/60 | Ht 65.0 in | Wt 122.0 lb

## 2019-09-12 DIAGNOSIS — K5904 Chronic idiopathic constipation: Secondary | ICD-10-CM | POA: Insufficient documentation

## 2019-09-12 DIAGNOSIS — R140 Abdominal distension (gaseous): Secondary | ICD-10-CM

## 2019-09-12 NOTE — Progress Notes (Signed)
Mary Bend, NP   Chief Complaint  Patient presents with  . Abdominal Pain    more discomfort than pain, some pelvic discomfort, a lot of bloating and constipation, denies uti sx    HPI:      [name] is a 34 y.o. G0P0000 whose LMP was Patient's last menstrual period was 08/13/2019 (exact date)., presents today for conference re: severe abd bloating. Hx of IBS-D for 30 yrs, with loose stools 6-8 times daily. Changed to constipation 1/20 and now has BM Q2-3 days. Has to strain and doesn't feel like it all comes out. Has had SEVERE bloating since OCPs stopped 3/21 and changed to Paragard; sx most recently for 23 days. Had neg GYN u/s 5/21 due to discomfort. Taking fiber, probiotics, Citrucel, miralax without sx change. Has seen GI and has endoscopy and colonoscopy sched next month. FH colon cancer in MGM and MGF. No vag or urin sx. No n/v, fevers.  She had paragard removed last wk with PCP, started OCPs again last night. She is not currently sex active, but had dyspareunia with IUD. Used to have sharp pelvic and rectal pains, but those improved. Question of endometriosis in past due to sx.   Neg pap/neg HPV DNA 3/21  Past Medical History:  Diagnosis Date  . Hyperthyroidism    T 4 a little elevated just found out  . IBS (irritable bowel syndrome)     Past Surgical History:  Procedure Laterality Date  . LEEP    . TONSILLECTOMY  10/31/2011   Procedure: TONSILLECTOMY;  Surgeon: Osborn Coho, MD;  Location: [facility];  Service: ENT;  Laterality: Bilateral;    Family History  Problem Relation Age of Onset  . Hypertension Father   . GER disease Father   . Cancer Maternal Grandmother        LUNG  . Healthy Mother     Social History   Socioeconomic History  . Marital status: Single    Spouse name: Not on file  . Number of children: 1  . Years of education: Not on file  . Highest education level: Not on file  Occupational History  .  Occupation: nursing school  Tobacco Use  . Smoking status: Never Smoker  . Smokeless tobacco: Never Used  Vaping Use  . Vaping Use: Never used  Substance and Sexual Activity  . Alcohol use: Not Currently    Comment: couple times a month  . Drug use: No  . Sexual activity: Not Currently    Birth control/protection: Pill  Other Topics Concern  . Not on file  Social History Narrative   Regular Exercise yes, 3-4 days a week goes to gym      Diet: fruits and veggies, water, no soda, minimal caffeiene    Social Determinants of Health   Financial Resource Strain:   . Difficulty of Paying Living Expenses:   Food Insecurity:   . Worried About Programme researcher, broadcasting/film/video in the Last Year:   . Barista in the Last Year:   Transportation Needs:   . Freight forwarder (Medical):   Marland Kitchen Lack of Transportation (Non-Medical):   Physical Activity:   . Days of Exercise per Week:   . Minutes of Exercise per Session:   Stress:   . Feeling of Stress :   Social Connections:   . Frequency of Communication with Friends and Family:   . Frequency of Social Gatherings with Friends and Family:   .  Attends Religious Services:   . Active Member of Clubs or Organizations:   . Attends Banker Meetings:   Marland Kitchen Marital Status:   Intimate Partner Violence:   . Fear of Current or Ex-Partner:   . Emotionally Abused:   Marland Kitchen Physically Abused:   . Sexually Abused:     Outpatient Medications Prior to Visit  Medication Sig Dispense Refill  . ketoconazole (NIZORAL) 2 % shampoo TOPICAL WASH HAIR AND APPLY AMPLY AMOUNT 2 3 TIMES PER WEEK.    . nitrofurantoin (MACRODANTIN) 50 MG capsule Take 50 mg by mouth at bedtime.    . tretinoin (RETIN-A) 0.05 % cream SMARTSIG:Sparingly Topical Every Night    . TRI-LO-MARZIA 0.18/0.215/0.25 MG-25 MCG tab Take 1 tablet by mouth daily.     Facility-Administered Medications Prior to Visit  Medication Dose Route Frequency Provider Last Rate Last Admin  . paragard  intrauterine copper IUD   Intrauterine Once Anarely Nicholls B, PA-C          ROS:  Review of Systems  Constitutional: Negative for fever.  Gastrointestinal: Positive for abdominal distention and constipation. Negative for blood in stool, diarrhea, nausea and vomiting.  Genitourinary: Negative for dyspareunia, dysuria, flank pain, frequency, hematuria, urgency, vaginal bleeding, vaginal discharge and vaginal pain.  Musculoskeletal: Negative for back pain.  Skin: Negative for rash.  BREAST: No symptoms   OBJECTIVE:   Vitals:  BP 90/60   Ht 5\' 5"  (1.651 m)   Wt 122 lb (55.3 kg)   LMP 08/13/2019 (Exact Date)   BMI 20.30 kg/m   Physical Exam Vitals reviewed.  Constitutional:      Appearance: She is well-developed.  Pulmonary:     Effort: Pulmonary effort is normal.  Musculoskeletal:        General: Normal range of motion.     Cervical back: Normal range of motion.  Skin:    General: Skin is warm and dry.  Neurological:     General: No focal deficit present.     Mental Status: She is alert and oriented to person, place, and time.     Cranial Nerves: No cranial nerve deficit.  Psychiatric:        Mood and Affect: Mood normal.        Behavior: Behavior normal.        Thought Content: Thought content normal.        Judgment: Judgment normal.      Assessment/Plan: Chronic idiopathic constipation  Bloating  Pt had neg GYN u/s 5/21. Discussed most likely GI etiology. Since meds and supp not helpful, suggested she do diet changes for gluten and dairy sensitivity. Also pt restarting OCPs and bloating sx better when she was on them. Could be hormonal component since sx worse once changed to IUD. F/u prn.    Return if symptoms worsen or fail to improve.  Kortney Potvin B. Lily Kernen, PA-C 09/12/2019 4:17 PM

## 2019-09-12 NOTE — Patient Instructions (Signed)
I value your feedback and entrusting us with your care. If you get a [facility] patient survey, I would appreciate you taking the time to let us know about your experience today. Thank you!  As of January 06, 2019, your lab results will be released to your MyChart immediately, before I even have a chance to see them. Please give me time to review them and contact you if there are any abnormalities. Thank you for your patience.  

## 2019-09-28 NOTE — Telephone Encounter (Signed)
Noted. Paragard reserved for this patient. 

## 2019-09-28 NOTE — Telephone Encounter (Signed)
Patient is scheduled for paraguard placement on Monday 10/10/19 with SDJ at 2 pm

## 2019-10-10 ENCOUNTER — Ambulatory Visit (INDEPENDENT_AMBULATORY_CARE_PROVIDER_SITE_OTHER): Payer: BC Managed Care – PPO | Admitting: Obstetrics and Gynecology

## 2019-10-10 ENCOUNTER — Other Ambulatory Visit: Payer: Self-pay

## 2019-10-10 ENCOUNTER — Encounter: Payer: Self-pay | Admitting: Obstetrics and Gynecology

## 2019-10-10 VITALS — BP 114/70 | Ht 65.0 in | Wt 124.0 lb

## 2019-10-10 DIAGNOSIS — Z30430 Encounter for insertion of intrauterine contraceptive device: Secondary | ICD-10-CM | POA: Diagnosis not present

## 2019-10-10 MED ORDER — PARAGARD INTRAUTERINE COPPER IU IUD: 1.0000 | INTRAUTERINE_SYSTEM | Freq: Once | INTRAUTERINE | 0 refills | Status: DC

## 2019-10-10 NOTE — Progress Notes (Signed)
°  °  IUD Insertion Procedure Note Patient identified, informed consent performed, consent signed.   Discussed risks of irregular bleeding, cramping, infection, malpositioning, expulsion or uterine perforation of the IUD (1:1000 placements)  which may require further procedure such as laparoscopy.  IUD while effective at preventing pregnancy do not prevent transmission of sexually transmitted diseases and use of barrier methods for this purpose was discussed. Time out was performed.  Urine pregnancy test negative.  Speculum placed in the vagina.  Cervix visualized.  Cleaned with Betadine x 2.  Grasped anteriorly with a single tooth tenaculum.  Uterus sounded to 7.5 cm. IUD placed per manufacturer's recommendations.  Strings trimmed to 3 cm. Tenaculum was removed, good hemostasis noted.  Patient tolerated procedure well.   Patient was given post-procedure instructions.  She was advised to have backup contraception for one week.  Patient was also asked to check IUD strings periodically and follow up in 4 weeks for IUD check.  Bedside ultrasound confirms the IUD is at the uterine fundus and in proper location.   Thomasene Mohair, MD, [facility], [facility] 10/10/2019 2:49 PM

## 2019-11-18 ENCOUNTER — Ambulatory Visit: Payer: BC Managed Care – PPO | Admitting: Obstetrics and Gynecology

## 2020-11-21 NOTE — Telephone Encounter (Signed)
Paragard rcvd/charged 10/10/2019

## 2021-03-27 ENCOUNTER — Other Ambulatory Visit: Payer: Self-pay | Admitting: Adult Health

## 2021-03-27 DIAGNOSIS — N6320 Unspecified lump in the left breast, unspecified quadrant: Secondary | ICD-10-CM

## 2021-04-17 ENCOUNTER — Other Ambulatory Visit: Payer: BC Managed Care – PPO

## 2021-04-18 ENCOUNTER — Other Ambulatory Visit: Payer: BC Managed Care – PPO

## 2021-05-22 ENCOUNTER — Ambulatory Visit
Admission: RE | Admit: 2021-05-22 | Discharge: 2021-05-22 | Disposition: A | Discharge status: Home / Self Care | Payer: BC Managed Care – PPO | Source: Ambulatory Visit | Attending: Adult Health | Admitting: Adult Health

## 2021-05-22 ENCOUNTER — Ambulatory Visit
Admission: RE | Admit: 2021-05-22 | Discharge: 2021-05-22 | Disposition: A | Discharge status: Home / Self Care | Payer: 59 | Source: Ambulatory Visit | Attending: Adult Health | Admitting: Adult Health

## 2021-05-22 DIAGNOSIS — N6320 Unspecified lump in the left breast, unspecified quadrant: Secondary | ICD-10-CM

## 2022-02-05 DIAGNOSIS — L709 Acne, unspecified: Secondary | ICD-10-CM | POA: Diagnosis not present

## 2022-03-13 DIAGNOSIS — E559 Vitamin D deficiency, unspecified: Secondary | ICD-10-CM | POA: Diagnosis not present

## 2022-03-13 DIAGNOSIS — Z1329 Encounter for screening for other suspected endocrine disorder: Secondary | ICD-10-CM | POA: Diagnosis not present

## 2022-03-13 DIAGNOSIS — Z131 Encounter for screening for diabetes mellitus: Secondary | ICD-10-CM | POA: Diagnosis not present

## 2022-03-13 DIAGNOSIS — N978 Female infertility of other origin: Secondary | ICD-10-CM | POA: Diagnosis not present

## 2022-03-13 DIAGNOSIS — Z1159 Encounter for screening for other viral diseases: Secondary | ICD-10-CM | POA: Diagnosis not present

## 2022-03-24 DIAGNOSIS — G43119 Migraine with aura, intractable, without status migrainosus: Secondary | ICD-10-CM | POA: Diagnosis not present

## 2022-03-27 ENCOUNTER — Other Ambulatory Visit: Payer: Self-pay | Admitting: Neurology

## 2022-03-27 DIAGNOSIS — G43119 Migraine with aura, intractable, without status migrainosus: Secondary | ICD-10-CM

## 2022-04-02 DIAGNOSIS — M9903 Segmental and somatic dysfunction of lumbar region: Secondary | ICD-10-CM | POA: Diagnosis not present

## 2022-04-02 DIAGNOSIS — M9906 Segmental and somatic dysfunction of lower extremity: Secondary | ICD-10-CM | POA: Diagnosis not present

## 2022-04-02 DIAGNOSIS — M9902 Segmental and somatic dysfunction of thoracic region: Secondary | ICD-10-CM | POA: Diagnosis not present

## 2022-04-02 DIAGNOSIS — M9901 Segmental and somatic dysfunction of cervical region: Secondary | ICD-10-CM | POA: Diagnosis not present

## 2022-04-15 DIAGNOSIS — M9906 Segmental and somatic dysfunction of lower extremity: Secondary | ICD-10-CM | POA: Diagnosis not present

## 2022-04-15 DIAGNOSIS — M9903 Segmental and somatic dysfunction of lumbar region: Secondary | ICD-10-CM | POA: Diagnosis not present

## 2022-04-15 DIAGNOSIS — M9901 Segmental and somatic dysfunction of cervical region: Secondary | ICD-10-CM | POA: Diagnosis not present

## 2022-04-15 DIAGNOSIS — M9902 Segmental and somatic dysfunction of thoracic region: Secondary | ICD-10-CM | POA: Diagnosis not present

## 2022-04-17 ENCOUNTER — Ambulatory Visit
Admission: RE | Admit: 2022-04-17 | Discharge: 2022-04-17 | Disposition: A | Discharge status: Home / Self Care | Payer: BC Managed Care – PPO | Source: Ambulatory Visit | Attending: Neurology | Admitting: Neurology

## 2022-04-17 ENCOUNTER — Ambulatory Visit
Admission: RE | Admit: 2022-04-17 | Discharge: 2022-04-17 | Disposition: A | Discharge status: Home / Self Care | Payer: 59 | Source: Ambulatory Visit | Attending: Neurology | Admitting: Neurology

## 2022-04-17 DIAGNOSIS — G43119 Migraine with aura, intractable, without status migrainosus: Secondary | ICD-10-CM

## 2022-04-17 DIAGNOSIS — G43009 Migraine without aura, not intractable, without status migrainosus: Secondary | ICD-10-CM | POA: Diagnosis not present

## 2022-04-17 MED ORDER — GADOPICLENOL 0.5 MMOL/ML IV SOLN
7.0000 mL | Freq: Once | INTRAVENOUS | Status: AC | PRN
  Administered 2022-04-17: 7 mL via INTRAVENOUS

## 2022-04-22 DIAGNOSIS — M9902 Segmental and somatic dysfunction of thoracic region: Secondary | ICD-10-CM | POA: Diagnosis not present

## 2022-04-22 DIAGNOSIS — M9903 Segmental and somatic dysfunction of lumbar region: Secondary | ICD-10-CM | POA: Diagnosis not present

## 2022-04-22 DIAGNOSIS — M9901 Segmental and somatic dysfunction of cervical region: Secondary | ICD-10-CM | POA: Diagnosis not present

## 2022-04-22 DIAGNOSIS — M9906 Segmental and somatic dysfunction of lower extremity: Secondary | ICD-10-CM | POA: Diagnosis not present

## 2022-04-23 DIAGNOSIS — M9903 Segmental and somatic dysfunction of lumbar region: Secondary | ICD-10-CM | POA: Diagnosis not present

## 2022-04-23 DIAGNOSIS — M9902 Segmental and somatic dysfunction of thoracic region: Secondary | ICD-10-CM | POA: Diagnosis not present

## 2022-04-23 DIAGNOSIS — M9901 Segmental and somatic dysfunction of cervical region: Secondary | ICD-10-CM | POA: Diagnosis not present

## 2022-04-23 DIAGNOSIS — M9906 Segmental and somatic dysfunction of lower extremity: Secondary | ICD-10-CM | POA: Diagnosis not present

## 2022-04-24 DIAGNOSIS — M9902 Segmental and somatic dysfunction of thoracic region: Secondary | ICD-10-CM | POA: Diagnosis not present

## 2022-04-24 DIAGNOSIS — M9903 Segmental and somatic dysfunction of lumbar region: Secondary | ICD-10-CM | POA: Diagnosis not present

## 2022-04-24 DIAGNOSIS — M9901 Segmental and somatic dysfunction of cervical region: Secondary | ICD-10-CM | POA: Diagnosis not present

## 2022-04-24 DIAGNOSIS — M9906 Segmental and somatic dysfunction of lower extremity: Secondary | ICD-10-CM | POA: Diagnosis not present

## 2022-04-25 DIAGNOSIS — M9901 Segmental and somatic dysfunction of cervical region: Secondary | ICD-10-CM | POA: Diagnosis not present

## 2022-04-25 DIAGNOSIS — M9906 Segmental and somatic dysfunction of lower extremity: Secondary | ICD-10-CM | POA: Diagnosis not present

## 2022-04-25 DIAGNOSIS — M9902 Segmental and somatic dysfunction of thoracic region: Secondary | ICD-10-CM | POA: Diagnosis not present

## 2022-04-25 DIAGNOSIS — M9903 Segmental and somatic dysfunction of lumbar region: Secondary | ICD-10-CM | POA: Diagnosis not present

## 2022-04-30 DIAGNOSIS — M9903 Segmental and somatic dysfunction of lumbar region: Secondary | ICD-10-CM | POA: Diagnosis not present

## 2022-04-30 DIAGNOSIS — M9902 Segmental and somatic dysfunction of thoracic region: Secondary | ICD-10-CM | POA: Diagnosis not present

## 2022-04-30 DIAGNOSIS — M9906 Segmental and somatic dysfunction of lower extremity: Secondary | ICD-10-CM | POA: Diagnosis not present

## 2022-04-30 DIAGNOSIS — M9901 Segmental and somatic dysfunction of cervical region: Secondary | ICD-10-CM | POA: Diagnosis not present

## 2022-05-07 DIAGNOSIS — M9903 Segmental and somatic dysfunction of lumbar region: Secondary | ICD-10-CM | POA: Diagnosis not present

## 2022-05-07 DIAGNOSIS — M9901 Segmental and somatic dysfunction of cervical region: Secondary | ICD-10-CM | POA: Diagnosis not present

## 2022-05-07 DIAGNOSIS — M9902 Segmental and somatic dysfunction of thoracic region: Secondary | ICD-10-CM | POA: Diagnosis not present

## 2022-05-07 DIAGNOSIS — M9906 Segmental and somatic dysfunction of lower extremity: Secondary | ICD-10-CM | POA: Diagnosis not present

## 2022-05-12 NOTE — Progress Notes (Unsigned)
PCP:  [name], NP   No chief complaint on file.    HPI:      [name] is a 37 y.o. G0P0000 whose LMP was No LMP recorded., presents today for her annual examination.  Her menses are {norm/abn:715}, lasting {number: 22536} days.  Dysmenorrhea {dysmen:716}. She {does:18564} have intermenstrual bleeding.  Sex activity: {sex active: 315163}. IUD placed 10/10/19 Last Pap: 04/25/19 Results were: no abnormalities /neg HPV DNA  Hx of STDs: {STD hx:14358}  Last mammogram: 05/22/21 Results were: normal--routine follow-up in 12 months. Has stable LT breast fibroadenoma There is no FH of breast cancer. There is no FH of ovarian cancer. The patient {does:18564} do self-breast exams.  Tobacco use: {tob:20664} Alcohol use: {Alcohol:11675} No drug use.  Exercise: {exercise:31265}  She {does:18564} get adequate calcium and Vitamin D in her diet.  Patient Active Problem List   Diagnosis Date Noted   Chronic idiopathic constipation 09/12/2019   History of seizure 09/02/2018   Chronic migraine with aura 09/02/2018   Cervical dysplasia 08/11/2016   Tonsillitis 10/31/2011   BV (bacterial vaginosis) 09/12/2010   DIARRHEA, CHRONIC 02/27/2009   GERD 10/13/2007    Past Surgical History:  Procedure Laterality Date   AUGMENTATION MAMMAPLASTY Bilateral    LEEP     TONSILLECTOMY  10/31/2011   Procedure: TONSILLECTOMY;  Surgeon: Osborn Coho, MD;  Location: [facility];  Service: ENT;  Laterality: Bilateral;    Family History  Problem Relation Age of Onset   Hypertension Father    GER disease Father    Cancer Maternal Grandmother        LUNG   Healthy Mother     Social History   Socioeconomic History   Marital status: Single    Spouse name: Not on file   Number of children: 1   Years of education: Not on file   Highest education level: Not on file  Occupational History   Occupation: nursing school  Tobacco Use   Smoking status: Never   Smokeless  tobacco: Never  Vaping Use   Vaping Use: Never used  Substance and Sexual Activity   Alcohol use: Not Currently    Comment: couple times a month   Drug use: No   Sexual activity: Not Currently    Birth control/protection: Pill  Other Topics Concern   Not on file  Social History Narrative   Regular Exercise yes, 3-4 days a week goes to gym      Diet: fruits and veggies, water, no soda, minimal caffeiene    Social Determinants of Health   Financial Resource Strain: Not on file  Food Insecurity: Not on file  Transportation Needs: Not on file  Physical Activity: Not on file  Stress: Not on file  Social Connections: Not on file  Intimate Partner Violence: Not on file     Current Outpatient Medications:    ketoconazole (NIZORAL) 2 % shampoo, TOPICAL WASH HAIR AND APPLY AMPLY AMOUNT 2 3 TIMES PER WEEK., Disp: , Rfl:    nitrofurantoin (MACRODANTIN) 50 MG capsule, Take 50 mg by mouth at bedtime., Disp: , Rfl:    paragard intrauterine copper IUD IUD, 1 Intra Uterine Device (1 each total) by Intrauterine route once for 1 dose., Disp: , Rfl: 0   tretinoin (RETIN-A) 0.05 % cream, SMARTSIG:Sparingly Topical Every Night, Disp: , Rfl:    TRI-LO-MARZIA 0.18/0.215/0.25 MG-25 MCG tab, Take 1 tablet by mouth daily., Disp: , Rfl:   Current Facility-Administered Medications:  paragard intrauterine copper IUD, , Intrauterine, Once, Koven Belinsky B, PA-C     ROS:  Review of Systems BREAST: No symptoms   Objective: There were no vitals taken for this visit.   OBGyn Exam  Results: No results found for this or any previous visit (from the past 24 hour(s)).  Assessment/Plan: No diagnosis found.  No orders of the defined types were placed in this encounter.            GYN counsel {counseling: 16159}     F/U  No follow-ups on file.  Jeanni Allshouse B. Joua Bake, PA-C 05/12/2022 8:02 PM

## 2022-05-13 ENCOUNTER — Other Ambulatory Visit (HOSPITAL_COMMUNITY)
Admission: RE | Admit: 2022-05-13 | Discharge: 2022-05-13 | Disposition: A | Discharge status: Home / Self Care | Payer: BC Managed Care – PPO | Source: Ambulatory Visit | Attending: Obstetrics and Gynecology | Admitting: Obstetrics and Gynecology

## 2022-05-13 ENCOUNTER — Encounter: Payer: Self-pay | Admitting: Obstetrics and Gynecology

## 2022-05-13 ENCOUNTER — Ambulatory Visit (INDEPENDENT_AMBULATORY_CARE_PROVIDER_SITE_OTHER): Payer: BC Managed Care – PPO | Admitting: Obstetrics and Gynecology

## 2022-05-13 VITALS — BP 106/70 | Ht 65.0 in | Wt 132.0 lb

## 2022-05-13 DIAGNOSIS — Z01419 Encounter for gynecological examination (general) (routine) without abnormal findings: Secondary | ICD-10-CM | POA: Diagnosis not present

## 2022-05-13 DIAGNOSIS — M9903 Segmental and somatic dysfunction of lumbar region: Secondary | ICD-10-CM | POA: Diagnosis not present

## 2022-05-13 DIAGNOSIS — Z124 Encounter for screening for malignant neoplasm of cervix: Secondary | ICD-10-CM | POA: Diagnosis not present

## 2022-05-13 DIAGNOSIS — Z87410 Personal history of cervical dysplasia: Secondary | ICD-10-CM

## 2022-05-13 DIAGNOSIS — Z30431 Encounter for routine checking of intrauterine contraceptive device: Secondary | ICD-10-CM

## 2022-05-13 DIAGNOSIS — M9901 Segmental and somatic dysfunction of cervical region: Secondary | ICD-10-CM | POA: Diagnosis not present

## 2022-05-13 DIAGNOSIS — D242 Benign neoplasm of left breast: Secondary | ICD-10-CM | POA: Insufficient documentation

## 2022-05-13 DIAGNOSIS — M9906 Segmental and somatic dysfunction of lower extremity: Secondary | ICD-10-CM | POA: Diagnosis not present

## 2022-05-13 DIAGNOSIS — Z1231 Encounter for screening mammogram for malignant neoplasm of breast: Secondary | ICD-10-CM

## 2022-05-13 DIAGNOSIS — M9902 Segmental and somatic dysfunction of thoracic region: Secondary | ICD-10-CM | POA: Diagnosis not present

## 2022-05-13 DIAGNOSIS — Z1151 Encounter for screening for human papillomavirus (HPV): Secondary | ICD-10-CM

## 2022-05-13 NOTE — Patient Instructions (Signed)
I value your feedback and you entrusting us with your care. If you get a [facility] patient survey, I would appreciate you taking the time to let us know about your experience today. Thank you!  [facility]: [phone]      

## 2022-05-14 DIAGNOSIS — M9901 Segmental and somatic dysfunction of cervical region: Secondary | ICD-10-CM | POA: Diagnosis not present

## 2022-05-14 DIAGNOSIS — M9906 Segmental and somatic dysfunction of lower extremity: Secondary | ICD-10-CM | POA: Diagnosis not present

## 2022-05-14 DIAGNOSIS — M9903 Segmental and somatic dysfunction of lumbar region: Secondary | ICD-10-CM | POA: Diagnosis not present

## 2022-05-14 DIAGNOSIS — M9902 Segmental and somatic dysfunction of thoracic region: Secondary | ICD-10-CM | POA: Diagnosis not present

## 2022-05-15 LAB — CYTOLOGY - PAP
Comment: NEGATIVE
Diagnosis: NEGATIVE
High risk HPV: NEGATIVE

## 2022-05-21 DIAGNOSIS — M9902 Segmental and somatic dysfunction of thoracic region: Secondary | ICD-10-CM | POA: Diagnosis not present

## 2022-05-21 DIAGNOSIS — M9901 Segmental and somatic dysfunction of cervical region: Secondary | ICD-10-CM | POA: Diagnosis not present

## 2022-05-21 DIAGNOSIS — M9903 Segmental and somatic dysfunction of lumbar region: Secondary | ICD-10-CM | POA: Diagnosis not present

## 2022-05-21 DIAGNOSIS — M9906 Segmental and somatic dysfunction of lower extremity: Secondary | ICD-10-CM | POA: Diagnosis not present

## 2022-05-27 DIAGNOSIS — M9901 Segmental and somatic dysfunction of cervical region: Secondary | ICD-10-CM | POA: Diagnosis not present

## 2022-05-27 DIAGNOSIS — M9902 Segmental and somatic dysfunction of thoracic region: Secondary | ICD-10-CM | POA: Diagnosis not present

## 2022-05-27 DIAGNOSIS — M9906 Segmental and somatic dysfunction of lower extremity: Secondary | ICD-10-CM | POA: Diagnosis not present

## 2022-05-27 DIAGNOSIS — M9903 Segmental and somatic dysfunction of lumbar region: Secondary | ICD-10-CM | POA: Diagnosis not present

## 2022-05-29 DIAGNOSIS — M9902 Segmental and somatic dysfunction of thoracic region: Secondary | ICD-10-CM | POA: Diagnosis not present

## 2022-05-29 DIAGNOSIS — M9903 Segmental and somatic dysfunction of lumbar region: Secondary | ICD-10-CM | POA: Diagnosis not present

## 2022-05-29 DIAGNOSIS — M9901 Segmental and somatic dysfunction of cervical region: Secondary | ICD-10-CM | POA: Diagnosis not present

## 2022-05-29 DIAGNOSIS — M9906 Segmental and somatic dysfunction of lower extremity: Secondary | ICD-10-CM | POA: Diagnosis not present

## 2022-06-02 DIAGNOSIS — M9906 Segmental and somatic dysfunction of lower extremity: Secondary | ICD-10-CM | POA: Diagnosis not present

## 2022-06-02 DIAGNOSIS — M9903 Segmental and somatic dysfunction of lumbar region: Secondary | ICD-10-CM | POA: Diagnosis not present

## 2022-06-02 DIAGNOSIS — M9902 Segmental and somatic dysfunction of thoracic region: Secondary | ICD-10-CM | POA: Diagnosis not present

## 2022-06-02 DIAGNOSIS — M9901 Segmental and somatic dysfunction of cervical region: Secondary | ICD-10-CM | POA: Diagnosis not present

## 2022-06-03 DIAGNOSIS — M9901 Segmental and somatic dysfunction of cervical region: Secondary | ICD-10-CM | POA: Diagnosis not present

## 2022-06-03 DIAGNOSIS — M9906 Segmental and somatic dysfunction of lower extremity: Secondary | ICD-10-CM | POA: Diagnosis not present

## 2022-06-03 DIAGNOSIS — M9903 Segmental and somatic dysfunction of lumbar region: Secondary | ICD-10-CM | POA: Diagnosis not present

## 2022-06-03 DIAGNOSIS — M9902 Segmental and somatic dysfunction of thoracic region: Secondary | ICD-10-CM | POA: Diagnosis not present

## 2022-06-04 DIAGNOSIS — M9901 Segmental and somatic dysfunction of cervical region: Secondary | ICD-10-CM | POA: Diagnosis not present

## 2022-06-04 DIAGNOSIS — M9902 Segmental and somatic dysfunction of thoracic region: Secondary | ICD-10-CM | POA: Diagnosis not present

## 2022-06-04 DIAGNOSIS — M9903 Segmental and somatic dysfunction of lumbar region: Secondary | ICD-10-CM | POA: Diagnosis not present

## 2022-06-04 DIAGNOSIS — M9906 Segmental and somatic dysfunction of lower extremity: Secondary | ICD-10-CM | POA: Diagnosis not present

## 2022-06-09 DIAGNOSIS — M9906 Segmental and somatic dysfunction of lower extremity: Secondary | ICD-10-CM | POA: Diagnosis not present

## 2022-06-09 DIAGNOSIS — M9901 Segmental and somatic dysfunction of cervical region: Secondary | ICD-10-CM | POA: Diagnosis not present

## 2022-06-09 DIAGNOSIS — M9903 Segmental and somatic dysfunction of lumbar region: Secondary | ICD-10-CM | POA: Diagnosis not present

## 2022-06-09 DIAGNOSIS — M9902 Segmental and somatic dysfunction of thoracic region: Secondary | ICD-10-CM | POA: Diagnosis not present

## 2022-06-16 DIAGNOSIS — F419 Anxiety disorder, unspecified: Secondary | ICD-10-CM | POA: Diagnosis not present

## 2022-06-30 DIAGNOSIS — F419 Anxiety disorder, unspecified: Secondary | ICD-10-CM | POA: Diagnosis not present

## 2023-04-09 IMAGING — MG MM  DIGITAL DIAGNOSTIC BREAST BILAT IMPLANT W/ TOMO W/ CAD
8 of 17 series · 8 of 40 positions shown · non-contrast
Comparison: LEFT breast ultrasound dated 03/05/2009.

CLINICAL DATA: Patient describes a palpable lump within the outer
LEFT breast. Patient states that the mass has been present for
years, but with a possible recent change in size.

LEFT breast ultrasound report on 03/05/2009 described a probably
benign fibroadenoma in the LEFT breast at the 3 o'clock axis.
EXAM:
DIGITAL DIAGNOSTIC BILATERAL MAMMOGRAM WITH IMPLANTS, CAD AND
TOMOSYNTHESIS; ULTRASOUND LEFT BREAST LIMITED
TECHNIQUE: Bilateral digital diagnostic mammography and breast tomosynthesis
was performed. The images were evaluated with computer-aided
detection. Standard and/or implant displaced views were performed.;
Targeted ultrasound examination of the left breast was performed.

[L MLO]
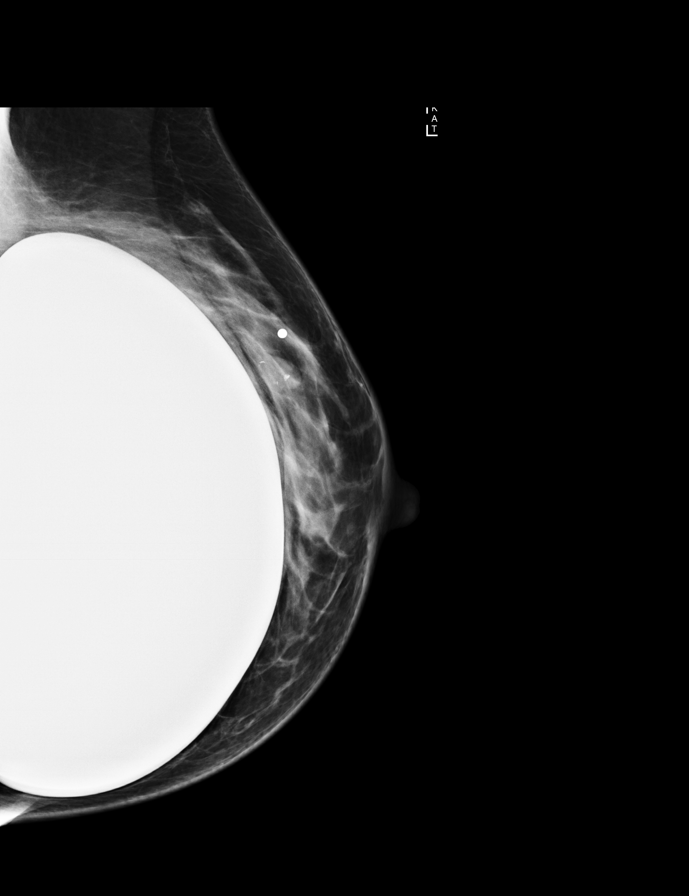

[L CC]
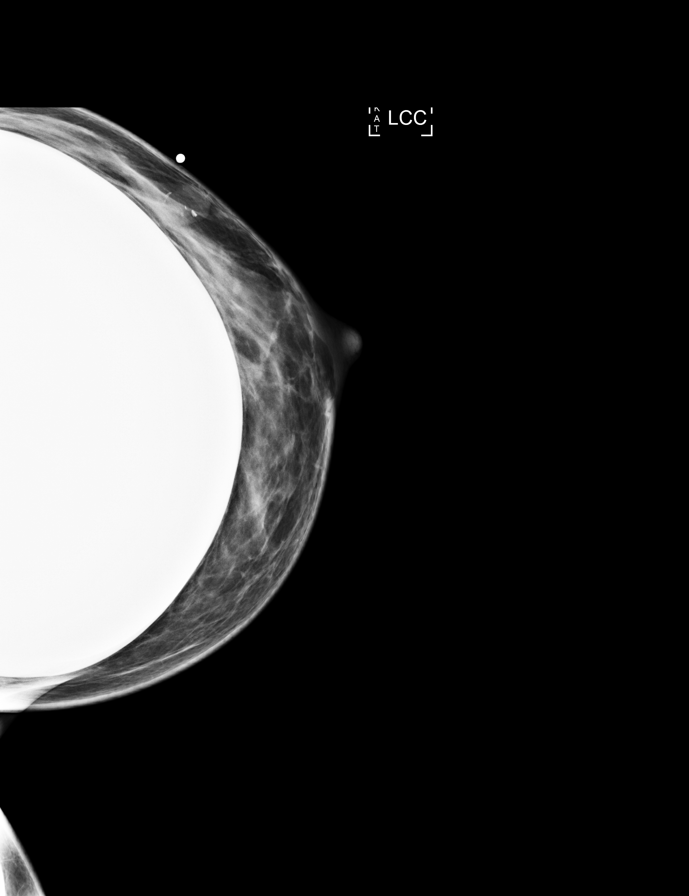

[R CC]
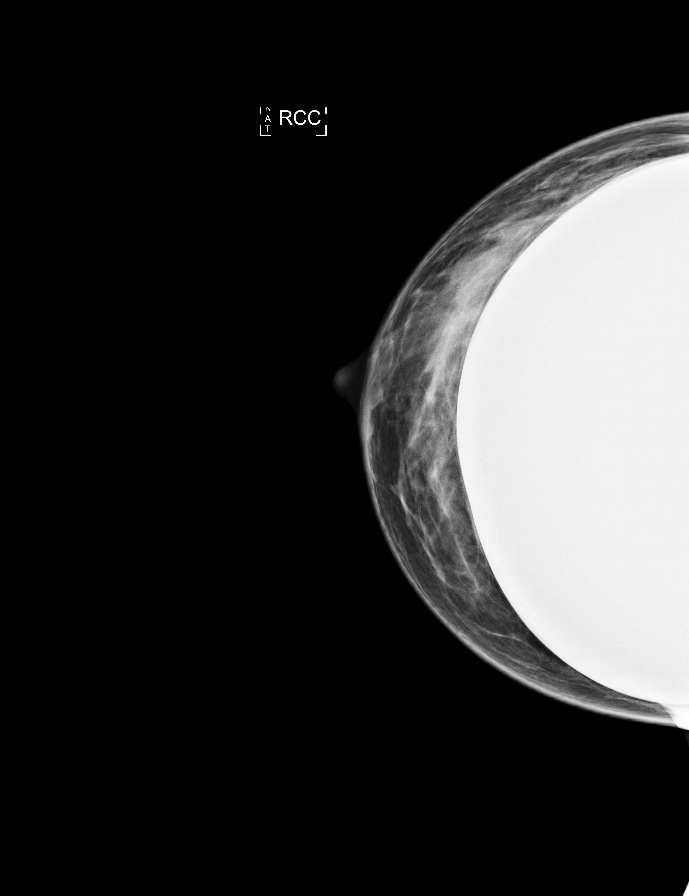

[R MLO]
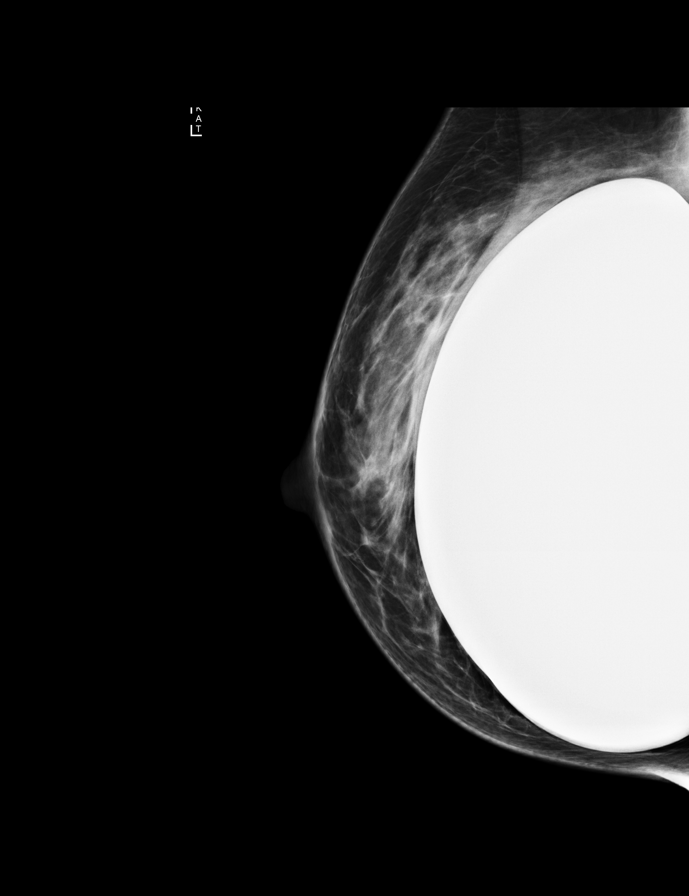

[R MLO synth-2D (1 of 2)]
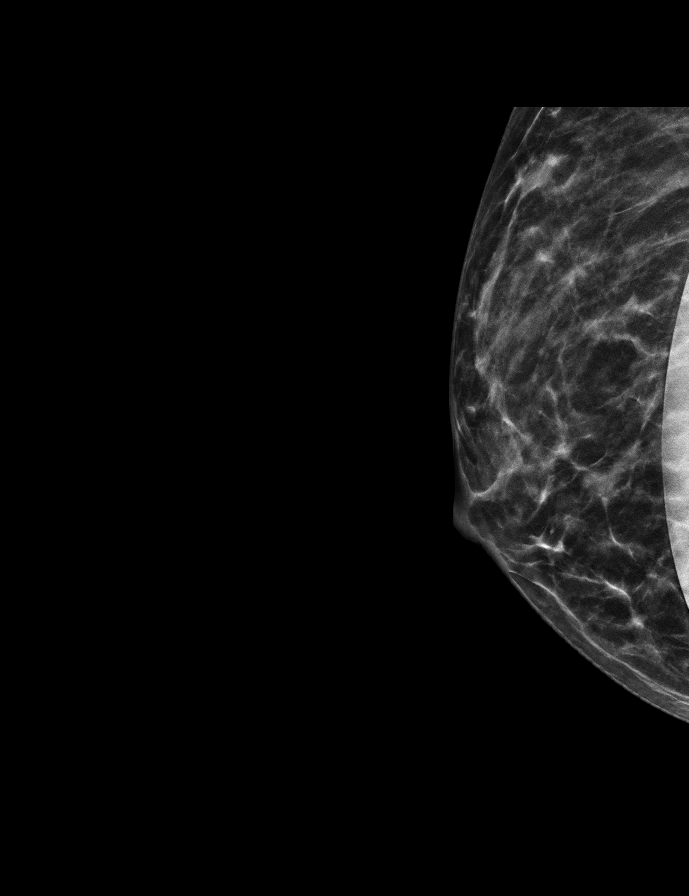

[R CC synth-2D]
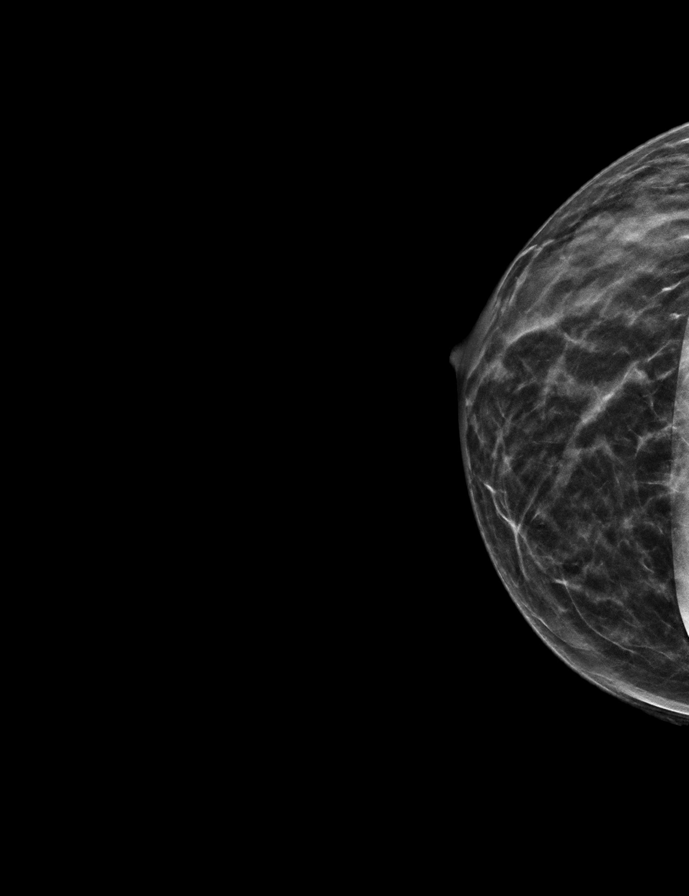

[L TAN synth-2D]
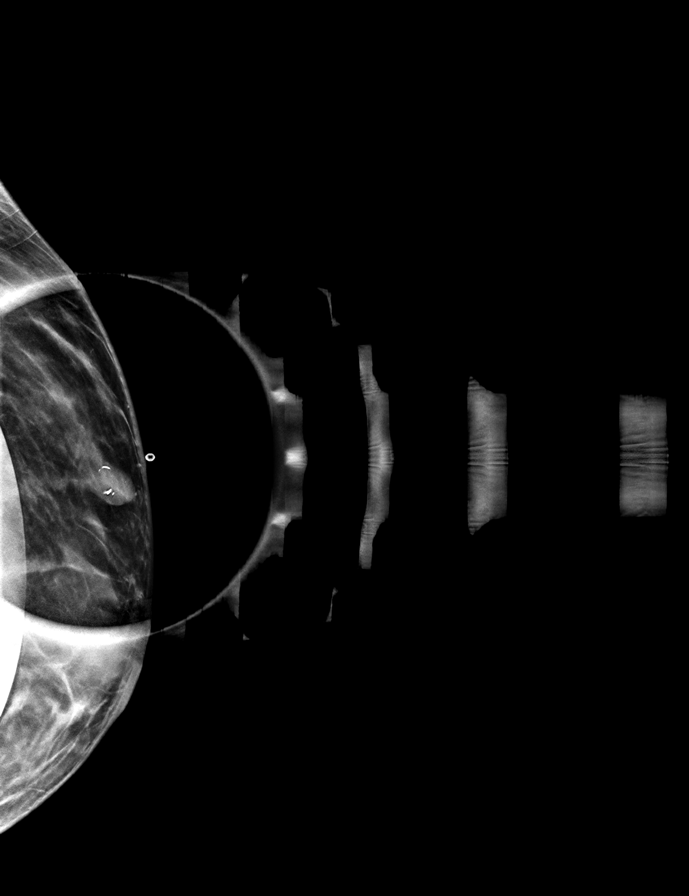

[R MLO synth-2D (2 of 2)]
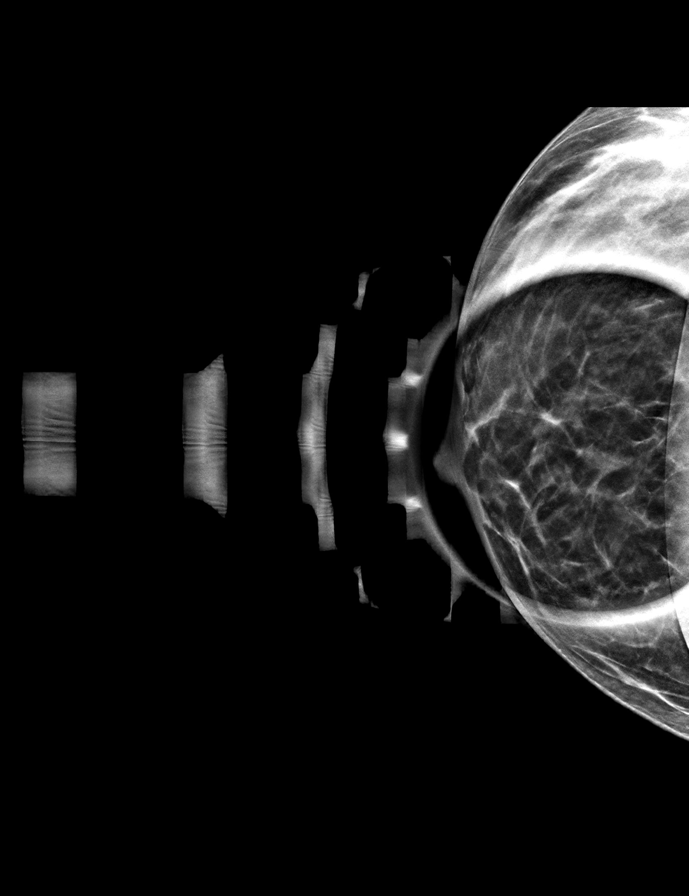

[8 of 40 positions shown; findings below may reference images not displayed]

ACR Breast Density Category c: The breast tissue is heterogeneously
dense, which may obscure small masses.
FINDINGS: There is a partially calcified mass within the outer LEFT breast,
partially obscured, measuring approximately 1.3 cm, corresponding to
the palpable area of concern with overlying skin marker in place.

There are no dominant masses, suspicious calcifications or secondary
signs of malignancy identified elsewhere within either breast.

The patient has retropectoral implants.

Targeted ultrasound is performed, again showing an oval
circumscribed hypoechoic mass in the LEFT breast at the 2 o'clock
axis (previously described as 3 o'clock axis), 3 cm from the nipple,
measuring 1.5 x 0.7 x 1.4 cm, not significantly changed in size
compared to the previous ultrasound of 03/05/2009.
IMPRESSION: 1. No evidence of malignancy within either breast.
2. Benign mass within the LEFT breast at the 2 o'clock axis,
measuring 1.5 cm, not significantly changed compared to previous
study of 03/05/2009 confirming benignity, presumed fibroadenoma.

RECOMMENDATION:
Screening mammogram in one year.(Code:G9-Z-9PV)

I have discussed the findings and recommendations with the patient.
If applicable, a reminder letter will be sent to the patient
regarding the next appointment.

BI-RADS CATEGORY  2: Benign.

## 2023-05-29 ENCOUNTER — Encounter: Payer: Self-pay | Admitting: Licensed Practical Nurse

## 2023-05-29 ENCOUNTER — Ambulatory Visit (INDEPENDENT_AMBULATORY_CARE_PROVIDER_SITE_OTHER): Admitting: Licensed Practical Nurse

## 2023-05-29 VITALS — BP 107/69 | HR 73 | Ht 65.0 in | Wt 147.1 lb

## 2023-05-29 DIAGNOSIS — Z30432 Encounter for removal of intrauterine contraceptive device: Secondary | ICD-10-CM

## 2023-05-29 DIAGNOSIS — Z01419 Encounter for gynecological examination (general) (routine) without abnormal findings: Secondary | ICD-10-CM

## 2023-05-29 NOTE — Progress Notes (Unsigned)
 Gynecology Annual Exam   PCP: Meri Stammer, NP  Chief Complaint:  Chief Complaint  Patient presents with   Gynecologic Exam    IUD Removal as well    History of Present Illness: Patient is a 38 y.o. G0P0000 presents for annual exam. Over the last couple of years the patient has neem dealing with bloating, she has seen multiple providers and has had extensive testing, she is currently seeing a Functional medicine provider and a [place] medicine provider. She would like her IUD removed incase this is the cause of her abdominal distension. She previously had a ParaGard  removed for the same reason-then after not seeing any changes she had a ParaGard  reinserted.  She has a natural cycles app which she has been using, plans to also use condoms.   Myeasha wonders if she should have a laparoscopic procedure for endo, since there has not been any cause for her distention, she wonders if it is endo. She has had a normal pelvic US  in the past. Denies any sxs consistent with endo.    LMP: Patient's last menstrual period was 05/29/2023 (exact date). Average Interval: regular,  28-30 days  days Duration of flow:  6-7  days Heavy Menses: no Clots: no Intermenstrual Bleeding: no Postcoital Bleeding: no Dysmenorrhea: occasional, on one side only   The patient is not sexually active (has been with female partner). She currently uses IUD for contraception. She denies dyspareunia.  The patient does perform self breast exams.  There is no notable family history of breast or ovarian cancer in her family. She has a mammogram in past for a lump which was benign.   The patient wears seatbelts: yes.   The patient has regular exercise: yes.    The patient denies current symptoms of depression.    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 Will need jaw  surgery in the fall.  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    Chronic idiopathic constipation 09/12/2019    History of seizure 09/02/2018    Chronic migraine with aura 09/02/2018    Cervical dysplasia 08/11/2016    Tonsillitis 10/31/2011     Class: Acute   BV (bacterial vaginosis) 09/12/2010    DIARRHEA, CHRONIC 02/27/2009     Qualifier: Diagnosis of  By: Cherlyn Cornet MD, Amy      GERD 10/13/2007     Qualifier: Diagnosis of  By: Cherlyn Cornet MD, Amy       Past Surgical History:  Past Surgical History:  Procedure Laterality Date   AUGMENTATION MAMMAPLASTY Bilateral    LEEP     TONSILLECTOMY  10/31/2011   Procedure: TONSILLECTOMY;  Surgeon: Ammon Bales, MD;  Location: [facility];  Service: ENT;  Laterality: Bilateral;    Gynecologic History:  Patient's last menstrual period was 05/29/2023 (exact date). Contraception: condoms and rhythm method Last Pap: Results were: NIL and HR HPV negative 2024, hx LEEP   Obstetric History: G0P0000  Family History:  Family History  Problem Relation Age of Onset   Hypertension Father    GER disease Father    Cancer Maternal Grandmother  LUNG   Healthy Mother     Social History:  Social History   Socioeconomic History   Marital status: Single    Spouse name: Not on file   Number of children: 1   Years of education: Not on file   Highest education level: Not on file  Occupational History   Occupation: nursing school  Tobacco Use   Smoking status: Never   Smokeless tobacco: Never  Vaping Use   Vaping status: Never Used  Substance and Sexual Activity   Alcohol use: Not Currently   Drug use: No   Sexual activity: Not Currently  Other Topics Concern   Not on file  Social History Narrative   Regular Exercise yes, 3-4 days a week goes to gym      Diet: fruits  and veggies, water, no soda, minimal caffeiene    Social Drivers of Corporate investment banker Strain: Not on file  Food Insecurity: No Food Insecurity (04/05/2021)   Received from [facility], [facility]   Hunger Vital Sign    Worried About Running Out of Food in the Last Year: Never true    Ran Out of Food in the Last Year: Never true  Transportation Needs: Not on file  Physical Activity: Not on file  Stress: Not on file  Social Connections: Unknown (06/11/2021)   Received from [facility], [facility]   Social Network    Social Network: Not on file  Intimate Partner Violence: Unknown (05/03/2021)   Received from [facility], [facility]   HITS    Physically Hurt: Not on file    Insult or Talk Down To: Not on file    Threaten Physical Harm: Not on file    Scream or Curse: Not on file    Allergies:  No Known Allergies  Medications: Prior to Admission medications   Not on File    Physical Exam Vitals: Blood pressure 107/69, pulse 73, height 5\' 5"  (1.651 m), weight 147 lb 1.6 oz (66.7 kg), last menstrual period 05/29/2023.  General: NAD HEENT: normocephalic, anicteric Thyroid : no enlargement, no palpable nodules Pulmonary: No increased work of breathing, CTAB Cardiovascular: RRR, distal pulses 2+ Breast: Breast symmetrical, no tenderness, no palpable nodules or masses, no skin or nipple retraction present, no nipple discharge.  No axillary or supraclavicular lymphadenopathy. Abdomen: NABS, soft, non-tender, slightly distended,  Umbilicus without lesions.  No hepatomegaly, splenomegaly or masses palpable. No evidence of hernia  Genitourinary:  External: Normal external female genitalia.  Normal urethral meatus, normal Bartholin's and Skene's glands.    Vagina: Normal vaginal mucosa, no evidence of prolapse.  Good tone   Cervix: Grossly normal in appearance, no bleeding  Uterus: Non-enlarged, mobile, normal contour.  No CMT  Adnexa: ovaries non-enlarged, no  adnexal masses  Rectal: deferred  Lymphatic: no evidence of inguinal lymphadenopathy Extremities: no edema, erythema, or tenderness Neurologic: Grossly intact Psychiatric: mood appropriate, affect full      GYNECOLOGY OFFICE PROCEDURE NOTE  ELNOR PERRELLI is a 38 y.o. G0P0000 here for Liletta IUD removal. No GYN concerns.  Last pap smear was on 2024 and was normal.  IUD Removal  Patient identified, informed consent performed, consent signed.  Patient was in the dorsal lithotomy position, normal external genitalia was noted.  A speculum was placed in the patient's vagina, normal discharge was noted, no lesions. The cervix was visualized, no lesions, no abnormal discharge.  The strings of the IUD were grasped and pulled using ring forceps. The IUD  was removed in its entirety. Shown to pt.  Patient tolerated the procedure well.    Patient will use NFP and condoms for contraception.  Routine preventative health maintenance measures emphasized.    Frederich Jaksch, CNM   Assessment: 39 y.o. G0P0000 routine annual exam  Plan: Problem List Items Addressed This Visit   None Visit Diagnoses       Well woman exam    -  Primary     Encounter for IUD removal           2) STI screening  wasoffered and declined  2)  ASCCP guidelines and rational discussed.  Patient opts for every 3 years screening interval  3) Contraception - the patient is currently using  rhythm method.  She is happy with her current form of contraception and plans to continue IUD removed, see note   4) Routine healthcare maintenance including cholesterol, diabetes screening discussed managed by PCP  5) No follow-ups on file.  Anice Kerbs, CNM   [facility] 05/29/2023, 3:06 PM

## 2023-06-17 ENCOUNTER — Ambulatory Visit: Admitting: Certified Nurse Midwife

## 2023-06-17 VITALS — BP 127/83 | HR 75 | Ht 65.0 in | Wt 146.0 lb

## 2023-06-17 DIAGNOSIS — Z3202 Encounter for pregnancy test, result negative: Secondary | ICD-10-CM

## 2023-06-17 DIAGNOSIS — Z30430 Encounter for insertion of intrauterine contraceptive device: Secondary | ICD-10-CM | POA: Diagnosis not present

## 2023-06-17 LAB — POCT URINE PREGNANCY: Preg Test, Ur: NEGATIVE

## 2023-06-17 MED ORDER — PARAGARD INTRAUTERINE COPPER IU IUD
1.0000 | INTRAUTERINE_SYSTEM | Freq: Once | INTRAUTERINE | Status: AC
  Administered 2023-06-17: 1 via INTRAUTERINE

## 2023-06-17 NOTE — Progress Notes (Signed)
   Chief Complaint  Patient presents with   Contraception     IUD PROCEDURE NOTE:  Mary Mcclure is a 38 y.o. G0P0000 here for Paragard   IUD insertion for contraception   BP 127/83 (BP Location: Right Arm, Patient Position: Sitting, Cuff Size: Normal)   Pulse 75   Ht 5\' 5"  (1.651 m)   Wt 146 lb (66.2 kg)   LMP 05/29/2023 (Exact Date)   BMI 24.30 kg/m   IUD Insertion Procedure Note Patient identified, informed consent performed, consent signed.   Discussed risks of irregular bleeding, cramping, infection, malpositioning or misplacement of the IUD outside the uterus which may require further procedure such as laparoscopy, risk of failure <1%. Time out was performed.  Urine pregnancy test negative.  Speculum placed in the vagina.  Cervix visualized.  Cleaned with Betadine x 2.  Grasped anteriorly with an allis clamp  Uterus sounded to 7 cm.   IUD placed per manufacturer's recommendations.  Strings trimmed to 3 cm. Allis was removed, good hemostasis noted.  Patient tolerated procedure well.   ASSESSMENT:  Encounter for IUD insertion - Plan: POCT urine pregnancy, paragard  intrauterine copper  IUD 1 each   Meds ordered this encounter  Medications   paragard  intrauterine copper  IUD 1 each     Plan:  Patient was given post-procedure instructions.  She was advised to have backup contraception for one week.   Call if you are having increasing pain, cramps or bleeding or if you have a fever greater than 100.4 degrees F., shaking chills, nausea or vomiting. Patient was also asked to check IUD strings periodically and follow up in 4 weeks for IUD check.  No follow-ups on file.  Forestine Igo, CNM 06/17/2023 4:54 PM
